# Patient Record
Sex: Male | Born: 1965
Health system: Southern US, Community
[De-identification: ages and names within clinical notes are randomized; demographics above are authoritative.]

## PROBLEM LIST (undated history)

## (undated) DIAGNOSIS — H9191 Unspecified hearing loss, right ear: Secondary | ICD-10-CM

## (undated) DIAGNOSIS — M19072 Primary osteoarthritis, left ankle and foot: Secondary | ICD-10-CM

## (undated) DIAGNOSIS — D333 Benign neoplasm of cranial nerves: Secondary | ICD-10-CM

## (undated) DIAGNOSIS — R29810 Facial weakness: Secondary | ICD-10-CM

## (undated) DIAGNOSIS — Z9889 Other specified postprocedural states: Secondary | ICD-10-CM

## (undated) DIAGNOSIS — R112 Nausea with vomiting, unspecified: Secondary | ICD-10-CM

## (undated) HISTORY — PX: OTHER SURGICAL HISTORY: SHX169

## (undated) HISTORY — PX: TONSILLECTOMY: SUR1361

## (undated) HISTORY — DX: Benign neoplasm of cranial nerves: D33.3

## (undated) HISTORY — PX: BRAIN SURGERY: SHX531

---

## 2004-05-12 ENCOUNTER — Emergency Department (HOSPITAL_COMMUNITY): Admission: EM | Admit: 2004-05-12 | Discharge: 2004-05-12 | Payer: Self-pay | Admitting: Family Medicine

## 2004-10-08 ENCOUNTER — Emergency Department (HOSPITAL_COMMUNITY): Admission: EM | Admit: 2004-10-08 | Discharge: 2004-10-08 | Payer: Self-pay | Admitting: Family Medicine

## 2005-10-25 ENCOUNTER — Ambulatory Visit: Payer: Self-pay | Admitting: Family Medicine

## 2005-11-11 ENCOUNTER — Ambulatory Visit: Payer: Self-pay | Admitting: Family Medicine

## 2007-09-22 ENCOUNTER — Ambulatory Visit: Payer: Self-pay | Admitting: Family Medicine

## 2007-09-22 DIAGNOSIS — I10 Essential (primary) hypertension: Secondary | ICD-10-CM | POA: Insufficient documentation

## 2007-09-23 ENCOUNTER — Ambulatory Visit: Payer: Self-pay | Admitting: Family Medicine

## 2010-07-09 ENCOUNTER — Encounter: Payer: Self-pay | Admitting: Family Medicine

## 2010-09-10 ENCOUNTER — Ambulatory Visit: Payer: Self-pay | Admitting: Family Medicine

## 2011-01-22 NOTE — Assessment & Plan Note (Signed)
Summary: bee stings on leg/leg swollen/cjr   Vital Signs:  Patient profile:   45 year old male Weight:      241 pounds O2 Sat:      97 % Temp:     98.6 degrees F Pulse rate:   97 / minute BP sitting:   120 / 84  (left arm) Cuff size:   large  Vitals Entered By: Pura Spice, RN (September 10, 2010 3:29 PM) CC: stung by yellow jackets left foot and ankle.   History of Present Illness: Here for swelling and discomfort in the left lower leg after being stung by 4 or 5 bees yesterday in his yard. No SOB or othe symptoms. He has been taking Benadryl and keeping the leg elevated today.   Allergies (verified): No Known Drug Allergies  Past History:  Past Medical History: Reviewed history from 09/22/2007 and no changes required. Hypertension  Review of Systems  The patient denies anorexia, fever, weight loss, weight gain, vision loss, decreased hearing, hoarseness, chest pain, syncope, dyspnea on exertion, prolonged cough, headaches, hemoptysis, abdominal pain, melena, hematochezia, severe indigestion/heartburn, hematuria, incontinence, genital sores, muscle weakness, suspicious skin lesions, transient blindness, difficulty walking, depression, unusual weight change, abnormal bleeding, enlarged lymph nodes, angioedema, breast masses, and testicular masses.    Physical Exam  General:  Well-developed,well-nourished,in no acute distress; alert,appropriate and cooperative throughout examination Extremities:  the left lower leg and foot shows swelling, erythema, and warmth. Slightly tender   Impression & Recommendations:  Problem # 1:  CELLULITIS, LEG, LEFT (ICD-682.6)  His updated medication list for this problem includes:    Keflex 500 Mg Caps (Cephalexin) .Marland Kitchen..Marland Kitchen Two times a day  Orders: Depo- Medrol 80mg  (J1040) Admin of Therapeutic Inj  intramuscular or subcutaneous (16109)  Complete Medication List: 1)  Keflex 500 Mg Caps (Cephalexin) .... Two times a day  Patient  Instructions: 1)  Please schedule a follow-up appointment as needed .  Prescriptions: KEFLEX 500 MG CAPS (CEPHALEXIN) two times a day  #14 x 0   Entered and Authorized by:   Nelwyn Salisbury MD   Signed by:   Nelwyn Salisbury MD on 09/10/2010   Method used:   Electronically to        [facility] [id number]* (retail)       [address]       [place], [place]  [address]       Ph: [phone]       Fax: [phone]   RxID:   [id number]    Medication Administration  Injection # 1:    Medication: Depo- Medrol 80mg     Diagnosis: CELLULITIS, LEG, LEFT (ICD-682.6)    Route: IM    Site: RUOQ gluteus    Exp Date: 03/2013    Lot #: [id number]    Mfr: Pharmacia    Patient tolerated injection without complications    Given by: Pura Spice, RN (September 10, 2010 4:12 PM)  Orders Added: 1)  Est. Patient Level III [44010] 2)  Depo- Medrol 80mg  [J1040] 3)  Admin of Therapeutic Inj  intramuscular or subcutaneous [27253]

## 2011-01-22 NOTE — Letter (Signed)
Summary: [facility]   Imported By: Maryln Gottron 07/12/2010 14:44:16  _____________________________________________________________________  External Attachment:    Type:   Image     Comment:   External Document

## 2011-04-05 ENCOUNTER — Ambulatory Visit (INDEPENDENT_AMBULATORY_CARE_PROVIDER_SITE_OTHER): Payer: BLUE CROSS/BLUE SHIELD | Admitting: Internal Medicine

## 2011-04-05 ENCOUNTER — Encounter: Payer: Self-pay | Admitting: Internal Medicine

## 2011-04-05 VITALS — BP 118/70 | Temp 98.5°F | Ht 73.0 in | Wt 239.0 lb

## 2011-04-05 DIAGNOSIS — J45909 Unspecified asthma, uncomplicated: Secondary | ICD-10-CM

## 2011-04-05 DIAGNOSIS — R05 Cough: Secondary | ICD-10-CM

## 2011-04-05 MED ORDER — HYDROCODONE-HOMATROPINE 5-1.5 MG/5ML PO SYRP: 5.0000 mL | ORAL_SOLUTION | Freq: Four times a day (QID) | ORAL | Status: AC | PRN

## 2011-04-05 MED ORDER — FEXOFENADINE HCL 180 MG PO TABS: 180.0000 mg | ORAL_TABLET | Freq: Every day | ORAL | Status: DC

## 2011-04-05 MED ORDER — MOMETASONE FURO-FORMOTEROL FUM 100-5 MCG/ACT IN AERO: 1.0000 | INHALATION_SPRAY | Freq: Two times a day (BID) | RESPIRATORY_TRACT | Status: DC

## 2011-04-05 MED ORDER — METHYLPREDNISOLONE ACETATE 80 MG/ML IJ SUSP
80.0000 mg | Freq: Once | INTRAMUSCULAR | Status: AC
  Administered 2011-04-05: 80 mg via INTRAMUSCULAR

## 2011-04-05 NOTE — Patient Instructions (Signed)
Get plenty of rest, Drink lots of  clear liquids, and use medications as directed  Call or return to clinic prn if these symptoms worsen or fail to improve as anticipated.

## 2011-04-05 NOTE — Progress Notes (Signed)
  Subjective:    Patient ID: Donald Johnson, male    DOB: August 22, 1966, 45 y.o.   MRN: [id number]  HPI  45 year old patient who has a remote history of asthma who presents with a two-week history of refractory incessant cough. This is interfering with sleep and is largely nonproductive he has had some mild associated wheezing cough has been refractory to Mucinex and in the eye albuterol. There's been no fever or chills denies any chest pain.    Review of Systems  Constitutional: Negative for fever, chills, appetite change and fatigue.  HENT: Positive for congestion and postnasal drip. Negative for hearing loss, ear pain, sore throat, trouble swallowing, neck stiffness, dental problem, voice change and tinnitus.   Eyes: Negative for pain, discharge and visual disturbance.  Respiratory: Positive for cough. Negative for chest tightness, wheezing and stridor.   Cardiovascular: Negative for chest pain, palpitations and leg swelling.  Gastrointestinal: Negative for nausea, vomiting, abdominal pain, diarrhea, constipation, blood in stool and abdominal distention.  Genitourinary: Negative for urgency, hematuria, flank pain, discharge, difficulty urinating and genital sores.  Musculoskeletal: Negative for myalgias, back pain, joint swelling, arthralgias and gait problem.  Skin: Negative for rash.  Neurological: Negative for dizziness, syncope, speech difficulty, weakness, numbness and headaches.  Hematological: Negative for adenopathy. Does not bruise/bleed easily.  Psychiatric/Behavioral: Negative for behavioral problems and dysphoric mood. The patient is not nervous/anxious.        Objective:   Physical Exam  Constitutional: He is oriented to person, place, and time. He appears well-developed.  HENT:  Head: Normocephalic.  Right Ear: External ear normal.  Left Ear: External ear normal.  Eyes: Conjunctivae and EOM are normal.  Neck: Normal range of motion.  Cardiovascular: Normal rate and  normal heart sounds.   Pulmonary/Chest: Effort normal. He has wheezes.       Some faint end expiratory wheezing  Abdominal: Bowel sounds are normal.  Musculoskeletal: Normal range of motion. He exhibits no edema and no tenderness.  Neurological: He is alert and oriented to person, place, and time.  Psychiatric: He has a normal mood and affect. His behavior is normal.          Assessment & Plan:   Asthma exacerbation with mild wheezing and cough. We'll treat aggressively with Depo-Medrol 80 mg IM. The patient was given samples of Provera metered dose inhaler Allegra. Is also given a prescription for an antitussives.

## 2011-07-18 ENCOUNTER — Other Ambulatory Visit: Payer: Self-pay | Admitting: Podiatry

## 2011-07-18 DIAGNOSIS — M779 Enthesopathy, unspecified: Secondary | ICD-10-CM

## 2011-07-18 DIAGNOSIS — R52 Pain, unspecified: Secondary | ICD-10-CM

## 2011-07-18 DIAGNOSIS — M1990 Unspecified osteoarthritis, unspecified site: Secondary | ICD-10-CM

## 2011-07-22 ENCOUNTER — Ambulatory Visit
Admission: RE | Admit: 2011-07-22 | Discharge: 2011-07-22 | Disposition: A | Discharge status: Home / Self Care | Payer: BLUE CROSS/BLUE SHIELD | Source: Ambulatory Visit | Attending: Podiatry | Admitting: Podiatry

## 2011-07-22 DIAGNOSIS — R52 Pain, unspecified: Secondary | ICD-10-CM

## 2011-07-22 DIAGNOSIS — M779 Enthesopathy, unspecified: Secondary | ICD-10-CM

## 2011-07-22 DIAGNOSIS — M1990 Unspecified osteoarthritis, unspecified site: Secondary | ICD-10-CM

## 2012-09-11 ENCOUNTER — Ambulatory Visit (INDEPENDENT_AMBULATORY_CARE_PROVIDER_SITE_OTHER): Payer: BLUE CROSS/BLUE SHIELD | Admitting: Family Medicine

## 2012-09-11 ENCOUNTER — Encounter: Payer: Self-pay | Admitting: Family Medicine

## 2012-09-11 VITALS — BP 116/78 | HR 86 | Temp 98.5°F | Wt 246.0 lb

## 2012-09-11 DIAGNOSIS — R42 Dizziness and giddiness: Secondary | ICD-10-CM

## 2012-09-11 LAB — BASIC METABOLIC PANEL
BUN: 19 mg/dL (ref 6–23)
CO2: 26 mEq/L (ref 19–32)
Calcium: 9.4 mg/dL (ref 8.4–10.5)
Chloride: 105 mEq/L (ref 96–112)
Creatinine, Ser: 1 mg/dL (ref 0.4–1.5)
GFR: 84.4 mL/min (ref >60.00)
Glucose, Bld: 84 mg/dL (ref 70–99)
Potassium: 4 mEq/L (ref 3.5–5.1)
Sodium: 139 mEq/L (ref 135–145)

## 2012-09-11 LAB — TSH: TSH: 0.7 u[IU]/mL (ref 0.35–5.50)

## 2012-09-11 LAB — POCT URINALYSIS DIPSTICK
Bilirubin, UA: NEGATIVE
Blood, UA: NEGATIVE
Glucose, UA: NEGATIVE
Ketones, UA: NEGATIVE
Leukocytes, UA: NEGATIVE
Nitrite, UA: NEGATIVE
Protein, UA: NEGATIVE
Spec Grav, UA: 1.02
Urobilinogen, UA: 0.2
pH, UA: 7.5

## 2012-09-11 LAB — HEPATIC FUNCTION PANEL
ALT: 42 U/L (ref 0–53)
AST: 26 U/L (ref 0–37)
Albumin: 4.3 g/dL (ref 3.5–5.2)
Alkaline Phosphatase: 47 U/L (ref 39–117)
Bilirubin, Direct: 0.2 mg/dL (ref 0.0–0.3)
Total Bilirubin: 0.9 mg/dL (ref 0.3–1.2)
Total Protein: 7.7 g/dL (ref 6.0–8.3)

## 2012-09-11 LAB — CBC WITH DIFFERENTIAL/PLATELET
Basophils Absolute: 0 10*3/uL (ref 0.0–0.1)
Basophils Relative: 0.6 % (ref 0.0–3.0)
Eosinophils Absolute: 0.1 10*3/uL (ref 0.0–0.7)
Eosinophils Relative: 1.1 % (ref 0.0–5.0)
HCT: 47.5 % (ref 39.0–52.0)
Hemoglobin: 16.4 g/dL (ref 13.0–17.0)
Lymphocytes Relative: 28.6 % (ref 12.0–46.0)
Lymphs Abs: 1.5 10*3/uL (ref 0.7–4.0)
MCHC: 34.4 g/dL (ref 30.0–36.0)
MCV: 90.7 fl (ref 78.0–100.0)
Monocytes Absolute: 0.5 10*3/uL (ref 0.1–1.0)
Monocytes Relative: 10.2 % (ref 3.0–12.0)
Neutro Abs: 3.1 10*3/uL (ref 1.4–7.7)
Neutrophils Relative %: 59.5 % (ref 43.0–77.0)
Platelets: 209 10*3/uL (ref 150.0–400.0)
RBC: 5.24 Mil/uL (ref 4.22–5.81)
RDW: 12.1 % (ref 11.5–14.6)
WBC: 5.2 10*3/uL (ref 4.5–10.5)

## 2012-09-11 LAB — LIPID PANEL
Cholesterol: 192 mg/dL (ref 0–200)
HDL: 37 mg/dL — ABNORMAL LOW (ref >39.00)
LDL Cholesterol: 122 mg/dL — ABNORMAL HIGH (ref 0–99)
Total CHOL/HDL Ratio: 5
Triglycerides: 167 mg/dL — ABNORMAL HIGH (ref 0.0–149.0)
VLDL: 33.4 mg/dL (ref 0.0–40.0)

## 2012-09-11 LAB — VITAMIN B12: Vitamin B-12: 358 pg/mL (ref 211–911)

## 2012-09-11 NOTE — Progress Notes (Signed)
  Subjective:    Patient ID: Donald Johnson, male    DOB: 1966/01/20, 46 y.o.   MRN: [id number]  HPI Here for 6 months of intermittent dizziness which is more pronounced when he moves quickly. The room does not spin but he feels a dysequilibrium and a slight unsteadiness. He thinks his hearing has decreased as well, and his family agrees. No tinnitus or nausea. No HAs. No sinus pressure or allergies. No vision changes or other neurologic deficits.    Review of Systems  Constitutional: Negative.   HENT: Positive for hearing loss. Negative for ear pain, congestion, rhinorrhea, sneezing, neck pain, neck stiffness, postnasal drip, sinus pressure, tinnitus and ear discharge.   Eyes: Negative.   Respiratory: Negative.   Cardiovascular: Negative.   Neurological: Positive for dizziness. Negative for tremors, seizures, syncope, facial asymmetry, speech difficulty, weakness, light-headedness, numbness and headaches.       Objective:   Physical Exam  Constitutional: He appears well-developed and well-nourished. No distress.  HENT:  Head: Normocephalic and atraumatic.  Right Ear: External ear normal.  Left Ear: External ear normal.  Nose: Nose normal.  Mouth/Throat: Oropharynx is clear and moist.  Eyes: Conjunctivae normal are normal. Pupils are equal, round, and reactive to light.  Neck: Neck supple. No thyromegaly present.  Cardiovascular: Normal rate, regular rhythm, normal heart sounds and intact distal pulses.   Pulmonary/Chest: Effort normal and breath sounds normal.  Lymphadenopathy:    He has no cervical adenopathy.  Neurological: He is alert. He has normal reflexes. No cranial nerve deficit. He exhibits normal muscle tone. Coordination normal.          Assessment & Plan:  Possible vestibular dysfunction. Get labs today. Try Claritin D. He may need an ENT referral.

## 2012-09-14 ENCOUNTER — Encounter: Payer: Self-pay | Admitting: Family Medicine

## 2012-09-14 NOTE — Progress Notes (Signed)
Quick Note:  I left voice message with results and put a copy in mail. ______ 

## 2013-05-24 ENCOUNTER — Ambulatory Visit (INDEPENDENT_AMBULATORY_CARE_PROVIDER_SITE_OTHER): Payer: BC Managed Care – PPO | Admitting: Family Medicine

## 2013-05-24 ENCOUNTER — Encounter: Payer: Self-pay | Admitting: Family Medicine

## 2013-05-24 VITALS — BP 114/84 | HR 81 | Temp 98.2°F | Wt 233.0 lb

## 2013-05-24 DIAGNOSIS — H9191 Unspecified hearing loss, right ear: Secondary | ICD-10-CM

## 2013-05-24 DIAGNOSIS — H9190 Unspecified hearing loss, unspecified ear: Secondary | ICD-10-CM

## 2013-05-24 DIAGNOSIS — H53451 Other localized visual field defect, right eye: Secondary | ICD-10-CM

## 2013-05-24 DIAGNOSIS — D333 Benign neoplasm of cranial nerves: Secondary | ICD-10-CM

## 2013-05-24 DIAGNOSIS — R42 Dizziness and giddiness: Secondary | ICD-10-CM

## 2013-05-24 DIAGNOSIS — H53459 Other localized visual field defect, unspecified eye: Secondary | ICD-10-CM

## 2013-05-24 NOTE — Progress Notes (Signed)
  Subjective:    Patient ID: Donald Johnson, male    DOB: 1966/10/07, 47 y.o.   MRN: [id number]  HPI Here to discuss hearing loss, dizziness, and blurred vision. We spoke last Spetember about some mild vertigo he was experiencing, but this has gotten worse. When he moves his head rapidly he gets dizzy and feels like the room is spinning briefly. He has had more hearing loss especially in the right ear. No pain or tinnitus. He has also noticed blurred vision in his right visual field when he looks to his right side. No HAs.    Review of Systems  Constitutional: Negative.   HENT: Positive for hearing loss. Negative for ear pain, sinus pressure, tinnitus and ear discharge.   Eyes: Positive for visual disturbance. Negative for photophobia.  Respiratory: Negative.   Cardiovascular: Negative.   Neurological: Positive for dizziness. Negative for tremors, seizures, syncope, facial asymmetry, speech difficulty, weakness, light-headedness, numbness and headaches.       Objective:   Physical Exam  Constitutional: He is oriented to person, place, and time. He appears well-developed and well-nourished. No distress.  HENT:  Head: Normocephalic and atraumatic.  Right Ear: External ear normal.  Left Ear: External ear normal.  Nose: Nose normal.  Mouth/Throat: Oropharynx is clear and moist.  Eyes: Conjunctivae are normal.  Neck: Neck supple. No thyromegaly present.  Cardiovascular: Normal rate, regular rhythm, normal heart sounds and intact distal pulses.   Pulmonary/Chest: Effort normal and breath sounds normal.  Lymphadenopathy:    He has no cervical adenopathy.  Neurological: He is alert and oriented to person, place, and time. He has normal reflexes. No cranial nerve deficit. He exhibits normal muscle tone. Coordination normal.          Assessment & Plan:  His symptoms are suggestive of Menieres but we will evaluate with a brain MRI. Also refer to ENT

## 2013-06-01 ENCOUNTER — Ambulatory Visit
Admission: RE | Admit: 2013-06-01 | Discharge: 2013-06-01 | Disposition: A | Discharge status: Home / Self Care | Payer: BC Managed Care – PPO | Source: Ambulatory Visit | Attending: Family Medicine | Admitting: Family Medicine

## 2013-06-01 DIAGNOSIS — R42 Dizziness and giddiness: Secondary | ICD-10-CM

## 2013-06-01 DIAGNOSIS — H53451 Other localized visual field defect, right eye: Secondary | ICD-10-CM

## 2013-06-01 DIAGNOSIS — H9191 Unspecified hearing loss, right ear: Secondary | ICD-10-CM

## 2013-06-01 MED ORDER — GADOBENATE DIMEGLUMINE 529 MG/ML IV SOLN
20.0000 mL | Freq: Once | INTRAVENOUS | Status: AC | PRN
  Administered 2013-06-01: 20 mL via INTRAVENOUS

## 2013-06-04 NOTE — Addendum Note (Signed)
Addended by: Gershon Crane A on: 06/04/2013 08:25 AM   Modules accepted: Orders

## 2013-06-07 ENCOUNTER — Telehealth: Payer: Self-pay | Admitting: Family Medicine

## 2013-06-07 DIAGNOSIS — D333 Benign neoplasm of cranial nerves: Secondary | ICD-10-CM

## 2013-06-07 NOTE — Telephone Encounter (Signed)
Referral was changed.

## 2013-06-07 NOTE — Telephone Encounter (Signed)
Pt states that you had done a referral for [facility], dx: large vestibular schwannoma. He would like to scrap that one and instead have that referral be for: [facility] - Dr. Dwyane Luo.  He also gave me the ph#, just in case for Joni Reining: [phone] Thank you.

## 2013-07-28 DIAGNOSIS — J45990 Exercise induced bronchospasm: Secondary | ICD-10-CM | POA: Insufficient documentation

## 2013-10-28 ENCOUNTER — Other Ambulatory Visit: Payer: Self-pay

## 2014-02-17 ENCOUNTER — Encounter: Payer: Self-pay | Admitting: Podiatry

## 2014-02-17 ENCOUNTER — Ambulatory Visit (INDEPENDENT_AMBULATORY_CARE_PROVIDER_SITE_OTHER): Payer: BC Managed Care – PPO

## 2014-02-17 ENCOUNTER — Ambulatory Visit (INDEPENDENT_AMBULATORY_CARE_PROVIDER_SITE_OTHER): Payer: BC Managed Care – PPO | Admitting: Podiatry

## 2014-02-17 VITALS — BP 115/88 | HR 64 | Resp 12

## 2014-02-17 DIAGNOSIS — M7750 Other enthesopathy of unspecified foot: Secondary | ICD-10-CM

## 2014-02-17 DIAGNOSIS — R52 Pain, unspecified: Secondary | ICD-10-CM

## 2014-02-17 DIAGNOSIS — M19079 Primary osteoarthritis, unspecified ankle and foot: Secondary | ICD-10-CM

## 2014-02-17 MED ORDER — TRIAMCINOLONE ACETONIDE 10 MG/ML IJ SUSP
10.0000 mg | Freq: Once | INTRAMUSCULAR | Status: AC
  Administered 2014-02-17: 10 mg

## 2014-02-17 NOTE — Progress Notes (Signed)
   Subjective:    Patient ID: Donald Johnson, male    DOB: Mar 16, 1966, 48 y.o.   MRN: [id number]  HPI '' LT FOOT ANKLE START BOTHERING FOR 6 MONTHS. THE PAIN IS GETTING WORSE WHEN WALKING.''    Review of Systems  All other systems reviewed and are negative.       Objective:   Physical Exam        Assessment & Plan:

## 2014-02-18 NOTE — Progress Notes (Signed)
Subjective:     Patient ID: Donald Johnson, male   DOB: 03-12-1966, 48 y.o.   MRN: [id number]  HPI patient presents left foot stating this tendon has really started to bother me again and I would like to consider orthotics or possible AFO if it could be of benefit. 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 Tenderness around the peroneal tendon group on the lateral side of the foot underneath the ankle joint    Assessment:     Foot structural changes leading to tendinitis of her chronic nature and long-term arthritis    Plan:     Reviewed condition and did ankle and peroneal injection left patient would make an orthotic candidate we discussed this to be done in the next few weeks

## 2014-03-02 ENCOUNTER — Ambulatory Visit (INDEPENDENT_AMBULATORY_CARE_PROVIDER_SITE_OTHER): Payer: BC Managed Care – PPO | Admitting: Podiatry

## 2014-03-02 DIAGNOSIS — M7750 Other enthesopathy of unspecified foot: Secondary | ICD-10-CM

## 2014-05-09 NOTE — Progress Notes (Signed)
Subjective:     Patient ID: Donald Johnson, male   DOB: Jan 21, 1966, 48 y.o.   MRN: [id number]  HPI patient presents to pick up insert   Review of Systems     Objective:   Physical Exam No change physical exam    Assessment:     Tendinitis    Plan:     Dispensed orthotics

## 2014-05-23 ENCOUNTER — Telehealth: Payer: Self-pay | Admitting: *Deleted

## 2014-05-23 NOTE — Telephone Encounter (Signed)
Looking to see if I can get a referral to Dr. Wylene Simmer at [facility] or get a return call from Dr. Paulla Dolly if he's still practicing.

## 2014-06-02 ENCOUNTER — Ambulatory Visit: Payer: BC Managed Care – PPO | Admitting: Occupational Therapy

## 2014-06-06 NOTE — Telephone Encounter (Signed)
Dr. Paulla Dolly called the patient.

## 2014-08-30 DIAGNOSIS — IMO0001 Reserved for inherently not codable concepts without codable children: Secondary | ICD-10-CM | POA: Insufficient documentation

## 2014-09-07 DIAGNOSIS — M2560 Stiffness of unspecified joint, not elsewhere classified: Secondary | ICD-10-CM | POA: Insufficient documentation

## 2014-09-07 DIAGNOSIS — M19072 Primary osteoarthritis, left ankle and foot: Secondary | ICD-10-CM | POA: Insufficient documentation

## 2014-12-22 ENCOUNTER — Encounter (HOSPITAL_BASED_OUTPATIENT_CLINIC_OR_DEPARTMENT_OTHER): Payer: Self-pay | Admitting: *Deleted

## 2014-12-28 ENCOUNTER — Other Ambulatory Visit: Payer: Self-pay | Admitting: Orthopedic Surgery

## 2014-12-29 ENCOUNTER — Encounter (HOSPITAL_BASED_OUTPATIENT_CLINIC_OR_DEPARTMENT_OTHER)
Admission: RE | Disposition: A | Discharge status: Home / Self Care | Payer: Self-pay | Source: Ambulatory Visit | Attending: Orthopedic Surgery

## 2014-12-29 ENCOUNTER — Ambulatory Visit (HOSPITAL_BASED_OUTPATIENT_CLINIC_OR_DEPARTMENT_OTHER): Payer: BLUE CROSS/BLUE SHIELD | Admitting: Anesthesiology

## 2014-12-29 ENCOUNTER — Ambulatory Visit (HOSPITAL_BASED_OUTPATIENT_CLINIC_OR_DEPARTMENT_OTHER)
Admission: RE | Admit: 2014-12-29 | Discharge: 2014-12-30 | Disposition: A | Discharge status: Home / Self Care | Payer: BLUE CROSS/BLUE SHIELD | Source: Ambulatory Visit | Attending: Orthopedic Surgery | Admitting: Orthopedic Surgery

## 2014-12-29 ENCOUNTER — Encounter (HOSPITAL_BASED_OUTPATIENT_CLINIC_OR_DEPARTMENT_OTHER): Payer: Self-pay | Admitting: Certified Registered"

## 2014-12-29 DIAGNOSIS — H9191 Unspecified hearing loss, right ear: Secondary | ICD-10-CM | POA: Diagnosis not present

## 2014-12-29 DIAGNOSIS — R29810 Facial weakness: Secondary | ICD-10-CM | POA: Diagnosis not present

## 2014-12-29 DIAGNOSIS — M19072 Primary osteoarthritis, left ankle and foot: Secondary | ICD-10-CM | POA: Diagnosis not present

## 2014-12-29 DIAGNOSIS — I10 Essential (primary) hypertension: Secondary | ICD-10-CM | POA: Insufficient documentation

## 2014-12-29 DIAGNOSIS — M19179 Post-traumatic osteoarthritis, unspecified ankle and foot: Secondary | ICD-10-CM | POA: Diagnosis present

## 2014-12-29 DIAGNOSIS — Z86011 Personal history of benign neoplasm of the brain: Secondary | ICD-10-CM | POA: Insufficient documentation

## 2014-12-29 DIAGNOSIS — Z881 Allergy status to other antibiotic agents status: Secondary | ICD-10-CM | POA: Diagnosis not present

## 2014-12-29 HISTORY — DX: Facial weakness: R29.810

## 2014-12-29 HISTORY — DX: Other specified postprocedural states: Z98.890

## 2014-12-29 HISTORY — DX: Unspecified hearing loss, right ear: H91.91

## 2014-12-29 HISTORY — DX: Benign neoplasm of cranial nerves: D33.3

## 2014-12-29 HISTORY — DX: Nausea with vomiting, unspecified: R11.2

## 2014-12-29 HISTORY — DX: Primary osteoarthritis, left ankle and foot: M19.072

## 2014-12-29 HISTORY — PX: ANKLE FUSION: SHX881

## 2014-12-29 HISTORY — DX: Other specified postprocedural states: R11.2

## 2014-12-29 LAB — POCT HEMOGLOBIN-HEMACUE: Hemoglobin: 15.8 g/dL (ref 13.0–17.0)

## 2014-12-29 SURGERY — ARTHRODESIS ANKLE
Anesthesia: Regional | Site: Ankle | Laterality: Left

## 2014-12-29 MED ORDER — MIDAZOLAM HCL 2 MG/2ML IJ SOLN
INTRAMUSCULAR | Status: AC
  Filled 2014-12-29: qty 2

## 2014-12-29 MED ORDER — OXYCODONE HCL 5 MG PO TABS
5.0000 mg | ORAL_TABLET | ORAL | Status: DC | PRN
  Administered 2014-12-30: 10 mg via ORAL
  Filled 2014-12-29: qty 2

## 2014-12-29 MED ORDER — BACITRACIN ZINC 500 UNIT/GM EX OINT
TOPICAL_OINTMENT | CUTANEOUS | Status: AC
  Filled 2014-12-29: qty 28.35

## 2014-12-29 MED ORDER — 0.9 % SODIUM CHLORIDE (POUR BTL) OPTIME
TOPICAL | Status: DC | PRN
  Administered 2014-12-29: 300 mL

## 2014-12-29 MED ORDER — LACTATED RINGERS IV SOLN
INTRAVENOUS | Status: DC
  Administered 2014-12-29 (×2): via INTRAVENOUS

## 2014-12-29 MED ORDER — LIDOCAINE HCL (CARDIAC) 20 MG/ML IV SOLN
INTRAVENOUS | Status: DC | PRN
  Administered 2014-12-29: 30 mg via INTRAVENOUS

## 2014-12-29 MED ORDER — HYDROMORPHONE HCL 1 MG/ML IJ SOLN: 0.2500 mg | INTRAMUSCULAR | Status: DC | PRN

## 2014-12-29 MED ORDER — MIDAZOLAM HCL 2 MG/2ML IJ SOLN
1.0000 mg | INTRAMUSCULAR | Status: DC | PRN
  Administered 2014-12-29: 2 mg via INTRAVENOUS

## 2014-12-29 MED ORDER — ASPIRIN EC 325 MG PO TBEC: 325.0000 mg | DELAYED_RELEASE_TABLET | Freq: Every day | ORAL | Status: DC

## 2014-12-29 MED ORDER — SODIUM CHLORIDE 0.9 % IV SOLN: INTRAVENOUS | Status: DC

## 2014-12-29 MED ORDER — SENNA 8.6 MG PO TABS: 1.0000 | ORAL_TABLET | Freq: Two times a day (BID) | ORAL | Status: DC

## 2014-12-29 MED ORDER — ONDANSETRON HCL 4 MG PO TABS: 4.0000 mg | ORAL_TABLET | Freq: Four times a day (QID) | ORAL | Status: DC | PRN

## 2014-12-29 MED ORDER — FENTANYL CITRATE 0.05 MG/ML IJ SOLN
INTRAMUSCULAR | Status: AC
Start: 2014-12-29 — End: 2014-12-29
  Filled 2014-12-29: qty 6

## 2014-12-29 MED ORDER — ONDANSETRON HCL 4 MG/2ML IJ SOLN
INTRAMUSCULAR | Status: DC | PRN
  Administered 2014-12-29: 4 mg via INTRAVENOUS

## 2014-12-29 MED ORDER — CEFAZOLIN SODIUM-DEXTROSE 2-3 GM-% IV SOLR
2.0000 g | INTRAVENOUS | Status: AC
  Administered 2014-12-29: 2 g via INTRAVENOUS

## 2014-12-29 MED ORDER — SODIUM CHLORIDE 0.9 % IV SOLN
INTRAVENOUS | Status: DC
  Administered 2014-12-29: 14:00:00 via INTRAVENOUS

## 2014-12-29 MED ORDER — PROPOFOL 10 MG/ML IV BOLUS
INTRAVENOUS | Status: DC | PRN
  Administered 2014-12-29: 200 mg via INTRAVENOUS

## 2014-12-29 MED ORDER — FENTANYL CITRATE 0.05 MG/ML IJ SOLN
50.0000 ug | INTRAMUSCULAR | Status: DC | PRN
  Administered 2014-12-29: 100 ug via INTRAVENOUS

## 2014-12-29 MED ORDER — CHLORHEXIDINE GLUCONATE 4 % EX LIQD: 60.0000 mL | Freq: Once | CUTANEOUS | Status: DC

## 2014-12-29 MED ORDER — DEXAMETHASONE SODIUM PHOSPHATE 10 MG/ML IJ SOLN
INTRAMUSCULAR | Status: DC | PRN
  Administered 2014-12-29: 10 mg via INTRAVENOUS

## 2014-12-29 MED ORDER — BUPIVACAINE-EPINEPHRINE (PF) 0.5% -1:200000 IJ SOLN
INTRAMUSCULAR | Status: DC | PRN
  Administered 2014-12-29: 30 mL via PERINEURAL

## 2014-12-29 MED ORDER — OXYCODONE HCL 5 MG PO TABS: 5.0000 mg | ORAL_TABLET | ORAL | Status: DC | PRN

## 2014-12-29 MED ORDER — BUPIVACAINE HCL (PF) 0.5 % IJ SOLN
INTRAMUSCULAR | Status: DC | PRN
  Administered 2014-12-29: 10 mL

## 2014-12-29 MED ORDER — MIDAZOLAM HCL 5 MG/5ML IJ SOLN
INTRAMUSCULAR | Status: DC | PRN
  Administered 2014-12-29: 2 mg via INTRAVENOUS

## 2014-12-29 MED ORDER — DOCUSATE SODIUM 100 MG PO CAPS: 100.0000 mg | ORAL_CAPSULE | Freq: Two times a day (BID) | ORAL | Status: DC

## 2014-12-29 MED ORDER — MORPHINE SULFATE 2 MG/ML IJ SOLN: 1.0000 mg | INTRAMUSCULAR | Status: DC | PRN

## 2014-12-29 MED ORDER — FENTANYL CITRATE 0.05 MG/ML IJ SOLN
INTRAMUSCULAR | Status: AC
  Filled 2014-12-29: qty 2

## 2014-12-29 MED ORDER — ONDANSETRON HCL 4 MG/2ML IJ SOLN: 4.0000 mg | Freq: Four times a day (QID) | INTRAMUSCULAR | Status: DC | PRN

## 2014-12-29 MED ORDER — CEFAZOLIN SODIUM-DEXTROSE 2-3 GM-% IV SOLR
INTRAVENOUS | Status: AC
  Filled 2014-12-29: qty 50

## 2014-12-29 MED ORDER — BACITRACIN ZINC 500 UNIT/GM EX OINT
TOPICAL_OINTMENT | CUTANEOUS | Status: DC | PRN
  Administered 2014-12-29: 1 via TOPICAL

## 2014-12-29 MED ORDER — ENOXAPARIN SODIUM 40 MG/0.4ML ~~LOC~~ SOLN: 40.0000 mg | SUBCUTANEOUS | Status: DC

## 2014-12-29 SURGICAL SUPPLY — 90 items
BANDAGE ESMARK 6X9 LF (GAUZE/BANDAGES/DRESSINGS) ×1 IMPLANT
BIT DRILL 11/64XX180123XX4 (BIT) ×1
BIT DRILL 11/64XX180123XX4.3 (BIT) IMPLANT
BLADE AVERAGE 25MMX9MM (BLADE)
BLADE AVERAGE 25X9 (BLADE) IMPLANT
BLADE MICRO SAGITTAL (BLADE) IMPLANT
BLADE SURG 15 STRL LF DISP TIS (BLADE) ×2 IMPLANT
BLADE SURG 15 STRL SS (BLADE) ×9
BNDG CMPR 9X6 STRL LF SNTH (GAUZE/BANDAGES/DRESSINGS) ×1
BNDG COHESIVE 4X5 TAN STRL (GAUZE/BANDAGES/DRESSINGS) ×3 IMPLANT
BNDG COHESIVE 6X5 TAN STRL LF (GAUZE/BANDAGES/DRESSINGS) ×3 IMPLANT
BNDG ESMARK 6X9 LF (GAUZE/BANDAGES/DRESSINGS) ×3
BOOT STEPPER DURA LG (SOFTGOODS) IMPLANT
BOOT STEPPER DURA MED (SOFTGOODS) IMPLANT
BOOT STEPPER DURA XLG (SOFTGOODS) IMPLANT
CHLORAPREP W/TINT 26ML (MISCELLANEOUS) ×3 IMPLANT
CLOSURE WOUND 1/2 X4 (GAUZE/BANDAGES/DRESSINGS)
COVER BACK TABLE 60X90IN (DRAPES) ×3 IMPLANT
COVER MAYO STAND STRL (DRAPES) IMPLANT
CUFF TOURNIQUET SINGLE 34IN LL (TOURNIQUET CUFF) ×3 IMPLANT
DECANTER SPIKE VIAL GLASS SM (MISCELLANEOUS) IMPLANT
DRAPE C-ARM 42X72 X-RAY (DRAPES) IMPLANT
DRAPE C-ARMOR (DRAPES) IMPLANT
DRAPE EXTREMITY T 121X128X90 (DRAPE) ×3 IMPLANT
DRAPE OEC MINIVIEW 54X84 (DRAPES) ×2 IMPLANT
DRAPE U-SHAPE 47X51 STRL (DRAPES) ×3 IMPLANT
DRAPE UTILITY XL STRL (DRAPES) IMPLANT
DRILL BIT 11/64XX180123XX4.3 (BIT) ×3
DRILL BIT 2.7X124 (BIT) ×2 IMPLANT
DRSG ADAPTIC 3X8 NADH LF (GAUZE/BANDAGES/DRESSINGS) IMPLANT
DRSG EMULSION OIL 3X3 NADH (GAUZE/BANDAGES/DRESSINGS) ×3 IMPLANT
DRSG PAD ABDOMINAL 8X10 ST (GAUZE/BANDAGES/DRESSINGS) ×6 IMPLANT
ELECT REM PT RETURN 9FT ADLT (ELECTROSURGICAL) ×3
ELECTRODE REM PT RTRN 9FT ADLT (ELECTROSURGICAL) ×1 IMPLANT
GAUZE SPONGE 4X4 12PLY STRL (GAUZE/BANDAGES/DRESSINGS) ×3 IMPLANT
GLOVE BIO SURGEON STRL SZ8 (GLOVE) ×3 IMPLANT
GLOVE BIOGEL PI IND STRL 7.0 (GLOVE) IMPLANT
GLOVE BIOGEL PI IND STRL 8 (GLOVE) ×1 IMPLANT
GLOVE BIOGEL PI INDICATOR 7.0 (GLOVE) ×2
GLOVE BIOGEL PI INDICATOR 8 (GLOVE) ×2
GLOVE ECLIPSE 6.5 STRL STRAW (GLOVE) ×2 IMPLANT
GLOVE EXAM NITRILE MD LF STRL (GLOVE) ×2 IMPLANT
GOWN STRL REUS W/ TWL LRG LVL3 (GOWN DISPOSABLE) ×1 IMPLANT
GOWN STRL REUS W/ TWL XL LVL3 (GOWN DISPOSABLE) ×1 IMPLANT
GOWN STRL REUS W/TWL LRG LVL3 (GOWN DISPOSABLE) ×3
GOWN STRL REUS W/TWL XL LVL3 (GOWN DISPOSABLE) ×3
GUIDE PIN (WIRE) ×2 IMPLANT
K-WIRE STABILIS MODEL 2.2X225 (WIRE) ×2 IMPLANT
KIT ASP BONE MRW 60CC (KITS) IMPLANT
MEPITEL 4 X 7.2 IN ×2 IMPLANT
NEEDLE HYPO 22GX1.5 SAFETY (NEEDLE) IMPLANT
NS IRRIG 1000ML POUR BTL (IV SOLUTION) ×3 IMPLANT
PACK BASIN DAY SURGERY FS (CUSTOM PROCEDURE TRAY) ×3 IMPLANT
PAD CAST 4YDX4 CTTN HI CHSV (CAST SUPPLIES) ×2 IMPLANT
PADDING CAST COTTON 4X4 STRL (CAST SUPPLIES) ×3
PADDING CAST COTTON 6X4 STRL (CAST SUPPLIES) ×3 IMPLANT
PENCIL BUTTON HOLSTER BLD 10FT (ELECTRODE) ×3 IMPLANT
PLATE ANTERIOR 95 DEG 20X70 (Plate) ×2 IMPLANT
PLATE COVERLOC ANT TALUS 17X13 (Plate) ×2 IMPLANT
PLATE COVERLOC TIBIA 15X20 (Plate) ×2 IMPLANT
PUTTY DBM STAGRAFT PLUS 2CC (Putty) ×2 IMPLANT
SANITIZER HAND PURELL 535ML FO (MISCELLANEOUS) ×3 IMPLANT
SCREW 4.5X32 (Screw) ×2 IMPLANT
SCREW 4.5X40 (Screw) ×4 IMPLANT
SCREW BONE STABILIS 4.5X30 (Screw) ×4 IMPLANT
SCREW BONE STABILIS 4.5X34 (Screw) ×4 IMPLANT
SCREW BONE STABILIS 4.5X38 (Screw) ×2 IMPLANT
SCREW COMPRESSION 6.5 X 75 MM ×2 IMPLANT
SHEET MEDIUM DRAPE 40X70 STRL (DRAPES) ×3 IMPLANT
SLEEVE SCD COMPRESS KNEE MED (MISCELLANEOUS) ×3 IMPLANT
SPLINT FAST PLASTER 5X30 (CAST SUPPLIES) ×40
SPLINT PLASTER CAST FAST 5X30 (CAST SUPPLIES) ×20 IMPLANT
SPONGE LAP 18X18 X RAY DECT (DISPOSABLE) ×3 IMPLANT
STOCKINETTE 6  STRL (DRAPES) ×2
STOCKINETTE 6 STRL (DRAPES) ×1 IMPLANT
STRIP CLOSURE SKIN 1/2X4 (GAUZE/BANDAGES/DRESSINGS) IMPLANT
SUCTION FRAZIER TIP 10 FR DISP (SUCTIONS) ×3 IMPLANT
SUT ETHILON 3 0 PS 1 (SUTURE) ×3 IMPLANT
SUT MNCRL AB 3-0 PS2 18 (SUTURE) ×3 IMPLANT
SUT VIC AB 0 SH 27 (SUTURE) ×2 IMPLANT
SUT VIC AB 2-0 SH 27 (SUTURE)
SUT VIC AB 2-0 SH 27XBRD (SUTURE) IMPLANT
SUT VICRYL 4-0 PS2 18IN ABS (SUTURE) IMPLANT
SYR BULB 3OZ (MISCELLANEOUS) ×3 IMPLANT
SYR CONTROL 10ML LL (SYRINGE) IMPLANT
TOWEL OR 17X24 6PK STRL BLUE (TOWEL DISPOSABLE) ×5 IMPLANT
TUBE CONNECTING 20'X1/4 (TUBING) ×1
TUBE CONNECTING 20X1/4 (TUBING) ×2 IMPLANT
UNDERPAD 30X30 INCONTINENT (UNDERPADS AND DIAPERS) ×3 IMPLANT
YANKAUER SUCT BULB TIP NO VENT (SUCTIONS) IMPLANT

## 2014-12-29 NOTE — Anesthesia Procedure Notes (Addendum)
Anesthesia Regional Block:  Popliteal block  Pre-Anesthetic Checklist: ,, timeout performed, Correct Patient, Correct Site, Correct Laterality, Correct Procedure, Correct Position, site marked, Risks and benefits discussed, pre-op evaluation, post-op pain management  Laterality: Left  Prep: Maximum Sterile Barrier Precautions used and chloraprep       Needles:  Injection technique: Single-shot  Needle Type: Echogenic Stimulator Needle     Needle Length: 9cm 9 cm Needle Gauge: 21 and 21 G    Additional Needles:  Procedures: ultrasound guided (picture in chart) and nerve stimulator Popliteal block  Nerve Stimulator or Paresthesia:  Response: Peroneal,  Response: Tibial,   Additional Responses:   Narrative:  Start time: 12/29/2014 8:50 AM End time: 12/29/2014 9:03 AM Injection made incrementally with aspirations every 5 mL.  Performed by: Personally  Anesthesiologist: Roderic Palau E  Additional Notes: 2% Lidocaine skin wheel. Saphenous block with 10cc of 0.5% Bupivicaine plain.   Procedure Name: LMA Insertion Date/Time: 12/29/2014 9:41 AM Performed by: Nevena Rozenberg Pre-anesthesia Checklist: Patient identified, Emergency Drugs available, Suction available and Patient being monitored Patient Re-evaluated:Patient Re-evaluated prior to inductionOxygen Delivery Method: Circle System Utilized Preoxygenation: Pre-oxygenation with 100% oxygen Intubation Type: IV induction Ventilation: Mask ventilation without difficulty LMA: LMA inserted LMA Size: 5.0 Number of attempts: 1 Airway Equipment and Method: bite block Placement Confirmation: positive ETCO2 Tube secured with: Tape Dental Injury: Teeth and Oropharynx as per pre-operative assessment

## 2014-12-29 NOTE — H&P (Signed)
Donald Johnson is an 49 y.o. male.   Chief Complaint: left ankle pain HPI: 49 y/o male with PMH of brain tumor excision c/o left ankle pain for many years.  Radiographs reveal varus ankle arthritis.  He has failed non op treatment and presents now for left ankle arthrodesis.  No changes in his health since I saw him last.  Past Medical History  Diagnosis Date  . Vestibular schwannoma surgery at [facility] 2014    right  . Arthritis of ankle, left   . Deafness in right ear   . Facial droop     right  . PONV (postoperative nausea and vomiting)     Past Surgical History  Procedure Laterality Date  . Tonsillectomy    . Repair torn ligaments ankle      left  . Brain surgery  2014 at [facility]    History reviewed. No pertinent family history. Social History:  reports that he has never smoked. He has never used smokeless tobacco. He reports that he drinks alcohol. He reports that he does not use illicit drugs.  Allergies:  Allergies  Allergen Reactions  . Amoxicillin Other (See Comments)    Felt like snakes were crawling on him    No prescriptions prior to admission    Results for orders placed or performed during the hospital encounter of 01-26-2015 (from the past 48 hour(s))  Hemoglobin-hemacue, POC     Status: None   Collection Time: 01/26/2015  8:41 AM  Result Value Ref Range   Hemoglobin 15.8 13.0 - 17.0 g/dL   No results found.  ROS  No recent f/c/n/v/wt loss  Blood pressure 133/104, pulse 62, temperature 97.8 F (36.6 C), temperature source Oral, resp. rate 12, height 6\' 2"  (1.88 m), weight 105.235 kg (232 lb), SpO2 100 %. Physical Exam  wn wd male in nad.  A and O  X4.  Mood and affect normal.  EOMI  L sided facial droop.  Resp unlabored.  L ankle with varus malalignment.  Skin healthy and intact.  2+ dp and pt pulses.  Feels LT normally throughout the foot.  5/5 strength in PF and DF of the ankle and toes.  Assessment/Plan L ankle varus arthritis - to OR for left ankle  arthrodesis.  The risks and benefits of the alternative treatment options have been discussed in detail.  The patient wishes to proceed with surgery and specifically understands risks of bleeding, infection, nerve damage, blood clots, need for additional surgery, amputation and death.   Wylene Simmer 2015-01-26, 9:18 AM

## 2014-12-29 NOTE — Transfer of Care (Signed)
Immediate Anesthesia Transfer of Care Note  Patient: Donald Johnson  Procedure(s) Performed: Procedure(s): LEFT ANKLE  ARTHRODESIS (Left)  Patient Location: PACU  Anesthesia Type:GA combined with regional for post-op pain  Level of Consciousness: awake and patient cooperative  Airway & Oxygen Therapy: Patient Spontanous Breathing and Patient connected to face mask oxygen  Post-op Assessment: Report given to PACU RN and Post -op Vital signs reviewed and stable  Post vital signs: Reviewed and stable  Complications: No apparent anesthesia complications

## 2014-12-29 NOTE — Anesthesia Preprocedure Evaluation (Addendum)
Anesthesia Evaluation  Patient identified by MRN, date of birth, ID band Patient awake    Reviewed: Allergy & Precautions, H&P , NPO status , Patient's Chart, lab work & pertinent test results  History of Anesthesia Complications (+) PONV  Airway Mallampati: I  TM Distance: >3 FB Neck ROM: Full    Dental no notable dental hx. (+) Teeth Intact, Dental Advisory Given   Pulmonary neg pulmonary ROS,  breath sounds clear to auscultation  Pulmonary exam normal       Cardiovascular negative cardio ROS  Rhythm:Regular Rate:Normal     Neuro/Psych Hx. Vestibular schwannoma with surgery 2014 negative neurological ROS  negative psych ROS   GI/Hepatic negative GI ROS, Neg liver ROS,   Endo/Other  negative endocrine ROS  Renal/GU negative Renal ROS  negative genitourinary   Musculoskeletal  (+) Arthritis -, Osteoarthritis,    Abdominal   Peds  Hematology negative hematology ROS (+)   Anesthesia Other Findings   Reproductive/Obstetrics negative OB ROS                           Anesthesia Physical Anesthesia Plan  ASA: II  Anesthesia Plan: General and Regional   Post-op Pain Management:    Induction: Intravenous  Airway Management Planned: LMA  Additional Equipment:   Intra-op Plan:   Post-operative Plan: Extubation in OR  Informed Consent: I have reviewed the patients History and Physical, chart, labs and discussed the procedure including the risks, benefits and alternatives for the proposed anesthesia with the patient or authorized representative who has indicated his/her understanding and acceptance.   Dental advisory given  Plan Discussed with: CRNA  Anesthesia Plan Comments:         Anesthesia Quick Evaluation

## 2014-12-29 NOTE — Brief Op Note (Signed)
12/29/2014  11:45 AM  PATIENT:  Donald Johnson  49 y.o. male  PRE-OPERATIVE DIAGNOSIS:  Left ankle arthritis   POST-OPERATIVE DIAGNOSIS:  Left ankle arthritis   Procedure(s): 1.  LEFT ANKLE  ARTHRODESIS 2.  Left ankle ap, lateral and AP foot xrays  SURGEON:  Wylene Simmer, MD  ASSISTANT: n/a  ANESTHESIA:   General, regional  EBL:  minimal   TOURNIQUET:   Total Tourniquet Time Documented: Thigh (Left) - 92 minutes Total: Thigh (Left) - 92 minutes   COMPLICATIONS:  None apparent  DISPOSITION:  Extubated, awake and stable to recovery.  DICTATION ID:  [id number]

## 2014-12-29 NOTE — Discharge Instructions (Signed)
Donald Simmer, MD [place]  Please read the following information regarding your care after surgery.  Medications  You only need a prescription for the narcotic pain medicine (ex. oxycodone, Percocet, Norco).  All of the other medicines listed below are available over the counter. X acetominophen (Tylenol) 650 mg every 4-6 hours as you need for minor pain X oxycodone as prescribed for moderate to severe pain   Narcotic pain medicine (ex. oxycodone, Percocet, Vicodin) will cause constipation.  To prevent this problem, take the following medicines while you are taking any pain medicine. X docusate sodium (Colace) 100 mg twice a day X senna (Senokot) 2 tablets twice a day  X To help prevent blood clots, take an aspirin (325 mg) once a day for a month after surgery.  You should also get up every hour while you are awake to move around.    Weight Bearing ? Bear weight when you are able on your operated leg or foot. ? Bear weight only on the heel of your operated foot in the post-op shoe. x Do not bear any weight on the operated leg or foot.  Cast / Splint / Dressing x Keep your splint or cast clean and dry.  Dont put anything (coat hanger, pencil, etc) down inside of it.  If it gets damp, use a hair dryer on the cool setting to dry it.  If it gets soaked, call the office to schedule an appointment for a cast change. ? Remove your dressing 3 days after surgery and cover the incisions with dry dressings.    After your dressing, cast or splint is removed; you may shower, but do not soak or scrub the wound.  Allow the water to run over it, and then gently pat it dry.  Swelling It is normal for you to have swelling where you had surgery.  To reduce swelling and pain, keep your toes above your nose for at least 3 days after surgery.  It may be necessary to keep your foot or leg elevated for several weeks.  If it hurts, it should be elevated.  Follow Up Call my office at [phone]  when you are discharged from the hospital or surgery center to schedule an appointment to be seen two weeks after surgery.  Call my office at [phone] if you develop a fever >101.5 F, nausea, vomiting, bleeding from the surgical site or severe pain.

## 2014-12-29 NOTE — Anesthesia Postprocedure Evaluation (Signed)
  Anesthesia Post-op Note  Patient: Donald Johnson  Procedure(s) Performed: Procedure(s): LEFT ANKLE  ARTHRODESIS (Left)  Patient Location: PACU  Anesthesia Type:General and block  Level of Consciousness: awake and alert   Airway and Oxygen Therapy: Patient Spontanous Breathing  Post-op Pain: none  Post-op Assessment: Post-op Vital signs reviewed, Patient's Cardiovascular Status Stable and Respiratory Function Stable  Post-op Vital Signs: Reviewed  Filed Vitals:   12/29/14 1200  BP: 109/63  Pulse: 76  Temp:   Resp: 13    Complications: No apparent anesthesia complications

## 2014-12-29 NOTE — Progress Notes (Signed)
Assisted Dr. Edmond Fitzgerald with left, ultrasound guided, popliteal block. Side rails up, monitors on throughout procedure. See vital signs in flow sheet. Tolerated Procedure well. °

## 2014-12-30 ENCOUNTER — Encounter (HOSPITAL_BASED_OUTPATIENT_CLINIC_OR_DEPARTMENT_OTHER): Payer: Self-pay | Admitting: Orthopedic Surgery

## 2014-12-30 DIAGNOSIS — M19072 Primary osteoarthritis, left ankle and foot: Secondary | ICD-10-CM | POA: Diagnosis not present

## 2014-12-30 NOTE — Op Note (Signed)
Donald Johnson, Donald Johnson             ACCOUNT NO.:  [id number]  MEDICAL RECORD NO.:  [id number]  LOCATION:                                 FACILITY:  PHYSICIAN:  Wylene Simmer, MD             DATE OF BIRTH:  DATE OF PROCEDURE:  12/29/2014 DATE OF DISCHARGE:                              OPERATIVE REPORT   PREOPERATIVE DIAGNOSIS:  Left ankle varus arthritis.  POSTOPERATIVE DIAGNOSIS:  Left ankle varus arthritis.  PROCEDURES: 1. Left ankle arthrodesis. 2. Left ankle AP, lateral and AP foot radiographs.  SURGEON:  Wylene Simmer, MD  ANESTHESIA:  General, regional.  ESTIMATED BLOOD LOSS:  Minimal.  TOURNIQUET TIME:  92 minutes at 250 mmHg.  COMPLICATIONS:  None apparent.  DISPOSITION:  Extubated, awake, and stable to recovery.  INDICATIONS FOR PROCEDURE:  The patient is a 49 year old male with a long history of left ankle pain.  He has failed nonoperative treatment to date and presents today for surgical correction of his varus ankle arthritis.  He understands the risks and benefits, the alternative treatment options, and elects surgical treatment.  He specifically understands the risks of bleeding, infection, nerve damage, blood clots, need for additional surgery, continued pain, amputation, and death.  PROCEDURE IN DETAIL:  After preoperative consent was obtained and the correct operative site was identified, the patient was brought to the operating room and placed supine on the operating table.  General anesthesia was induced.  Preoperative antibiotics were administered. Surgical time-out was taken.  Left lower extremity was prepped and draped in standard sterile fashion with tourniquet around the thigh. The extremity was exsanguinated and the tourniquet was inflated to 250 mmHg.  A longitudinal incision was then made anteriorly over the extensor hallucis longus tendon.  Sharp dissection was carried down through the skin and subcutaneous tissue.  Care was taken to protect  the branches of the superficial peroneal nerve.  The extensor retinaculum was incised and the interval between the extensor hallucis longus and tibialis anterior tendons was developed.  Neurovascular bundle was elevated and retracted laterally and protected throughout the case.  The anterior joint capsule was opened.  A large loose body was removed from the anterior ankle joint.  All the osteophytes were removed with a rongeur.  The ankle joint was then mobilized.  There was end-stage arthritis and remaining cartilage only covered about 40% of the joint surface.  This remaining cartilage was removed from the talar and tibial surfaces as well as the medial and lateral gutters with curettes and rongeurs.  Wound was irrigated copiously.  A small drill bit was used to perforate the subchondral bone on both sides of the joint in multiple locations.  The resultant bone graft was left in place.  A 0.25-inch curved osteotome was then used to break up the remaining subchondral bone.  The joint was reduced and provisionally pinned.  A guidepin was then inserted posterolaterally from the distal tibia to the neck of the talus.  AP foot and lateral ankle radiographs confirmed appropriate position of this guidepin.  A Tornier cannulated partially-threaded screw with a washer was then inserted over the guidepin and pulled the talus back  underneath the tibia and compressed the joint appropriately. The Tornier anterior ankle fusion plate was then selected and applied to the anterior aspect of the ankle.  It was secured distally with three nonlocking screws at the level of the talar neck.  The compression device was then applied and used to compress the joint appropriately. Two nonlocking screws were then inserted securing the plate to the tibia.  The remaining three holes in the proximal portion of the plate were drilled and filled with locking screws.  Final AP ankle, lateral ankle, and AP foot  radiographs showed appropriate position and length of all hardware and appropriate alignment of the tibiotalar joint. Demineralized bone matrix and cancellous allograft chips were packed into the joint prior to reduction.  The two locking caps were then inserted on the plate in tightened position.  The wound was irrigated copiously.  The extensor retinaculum was closed with running 0 Vicryl suture.  Subcutaneous tissue was approximated with Monocryl and the skin was closed with nylon.  The posterior incision was closed with horizontal mattress sutures of 3-0 nylon.  Sterile dressings were applied followed by well-padded short-leg splint.  Tourniquet had been released at 92 minutes after application of the dressings.  The patient was awakened from anesthesia and transported to the recovery room in stable condition.  FOLLOWUP PLAN:  The patient will be nonweightbearing on the left lower extremity.  He will be observed for pain control overnight.  He will start aspirin tomorrow for DVT prophylaxis.  RADIOGRAPHS:  AP ankle, lateral ankle and AP foot radiographs were obtained intraoperatively today.  These show interval arthrodesis of the ankle joint with anterior locking plate and posterior-to-anterior lag screw.  Hardware was appropriately positioned and at the appropriate length.  No other acute injuries noted.     Wylene Simmer, MD     JH/MEDQ  D:  12/29/2014  T:  12/30/2014  Job:  [id number]

## 2015-01-30 ENCOUNTER — Other Ambulatory Visit: Payer: Self-pay | Admitting: Otolaryngology

## 2015-01-30 DIAGNOSIS — D333 Benign neoplasm of cranial nerves: Secondary | ICD-10-CM

## 2015-02-08 ENCOUNTER — Ambulatory Visit
Admission: RE | Admit: 2015-02-08 | Discharge: 2015-02-08 | Disposition: A | Discharge status: Home / Self Care | Payer: BLUE CROSS/BLUE SHIELD | Source: Ambulatory Visit | Attending: Otolaryngology | Admitting: Otolaryngology

## 2015-02-08 DIAGNOSIS — D333 Benign neoplasm of cranial nerves: Secondary | ICD-10-CM

## 2015-02-08 MED ORDER — GADOBENATE DIMEGLUMINE 529 MG/ML IV SOLN
20.0000 mL | Freq: Once | INTRAVENOUS | Status: AC | PRN
  Administered 2015-02-08: 20 mL via INTRAVENOUS

## 2015-12-07 DIAGNOSIS — Z7689 Persons encountering health services in other specified circumstances: Secondary | ICD-10-CM

## 2016-03-16 IMAGING — MR MR HEAD WO/W CM
10 of 11 series · 33 of 48 positions shown · IV contrast (multihance)
Comparison: Prior MRI from 06/01/2013.

CLINICAL DATA: Follow-up examination for vestibular schwannoma,
status post resection. No new symptoms.

EXAM:
MRI HEAD WITHOUT AND WITH CONTRAST
TECHNIQUE: Multiplanar, multiecho pulse sequences of the brain and surrounding
structures were obtained without and with intravenous contrast.
CONTRAST:  20mL MULTIHANCE GADOBENATE DIMEGLUMINE 529 MG/ML IV SOLN

[Series 2: T1 · sagittal · 5.0mm · 0.45mm/px · 3 of 21 slices shown (1 of 3)]
[im 1/21]
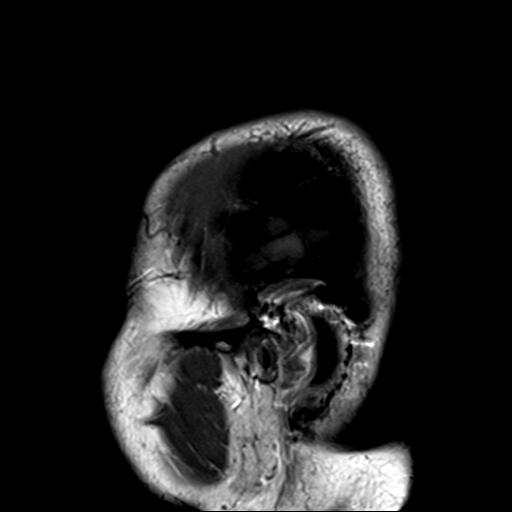
[im 11/21]
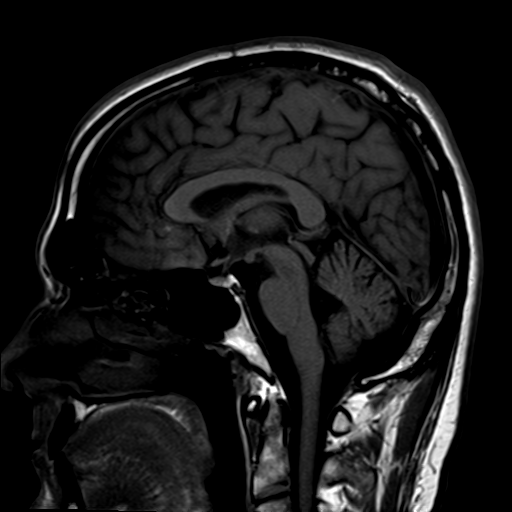
[im 21/21]
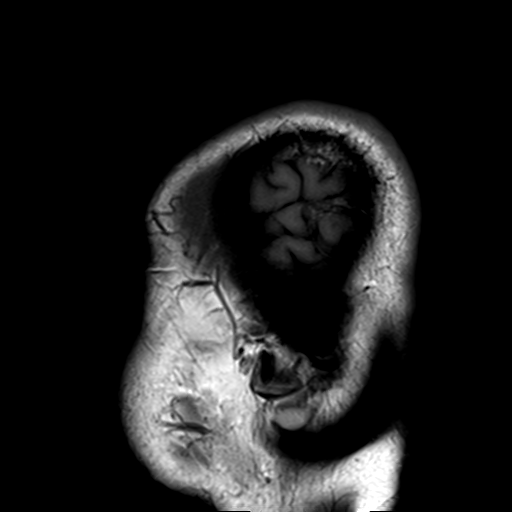

[Series 3: T2 · axial · 5.0mm · 0.45mm/px · z∈[-77,+66]mm · 3 of 23 slices shown]
[im 1/23]
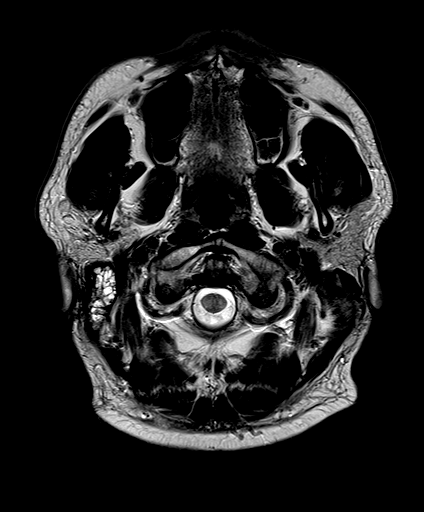
[im 12/23]
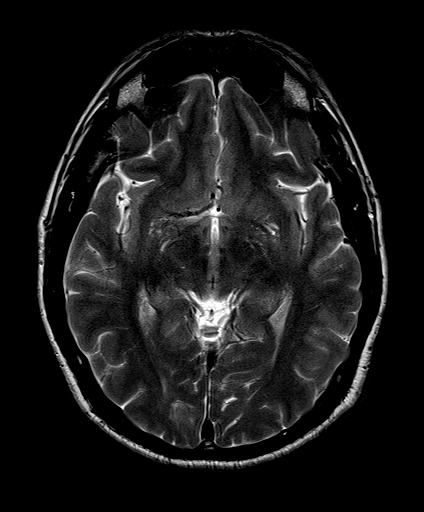
[im 23/23]
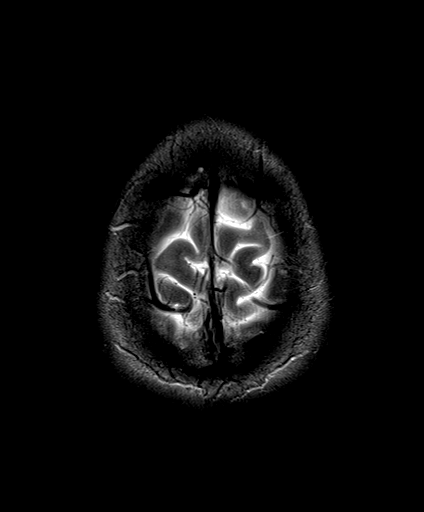

[Series 4: DWI · axial · 3.0mm · 1.80mm/px · z∈[-78,+69]mm · 9 of 100 slices shown (1 of 2)]
[im 1/100]
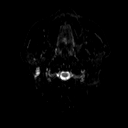
[im 17/100]
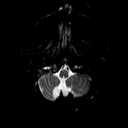
[im 34/100]
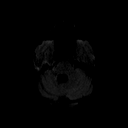
[im 42/100]
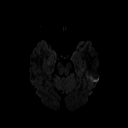
[im 50/100]
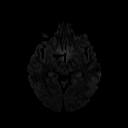
[im 58/100]
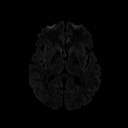
[im 67/100]
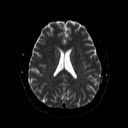
[im 83/100]
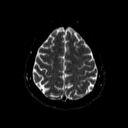
[im 100/100]
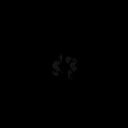

[Series 5: DWI · axial · 3.0mm · 1.80mm/px · z∈[-78,+69]mm · 6 of 46 slices shown (2 of 2)]
[im 1/46]
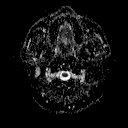
[im 10/46]
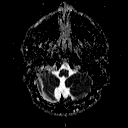
[im 19/46]
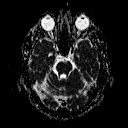
[im 28/46]
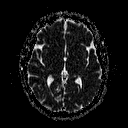
[im 37/46]
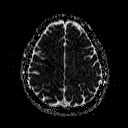
[im 46/46]
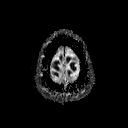

[Series 6: T1 · coronal · 3.0mm · 0.35mm/px · 2 of 13 slices shown (2 of 3)]
[im 1/13]
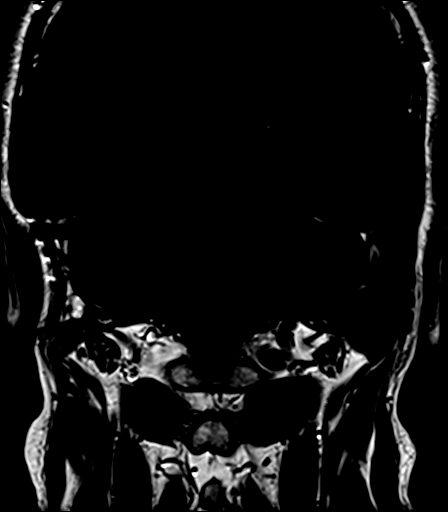
[im 13/13]
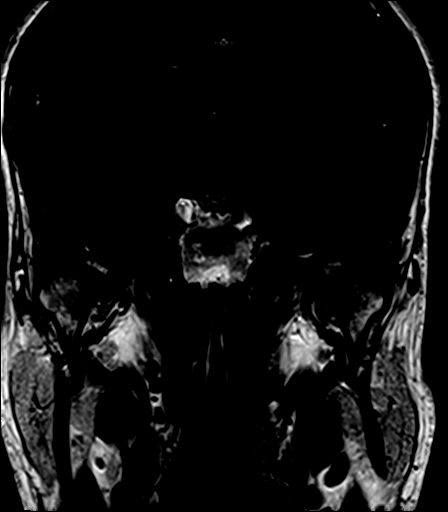

[Series 7: FLAIR · axial · 5.0mm · 0.45mm/px · z∈[-76,+67]mm · 3 of 23 slices shown]
[im 1/23]
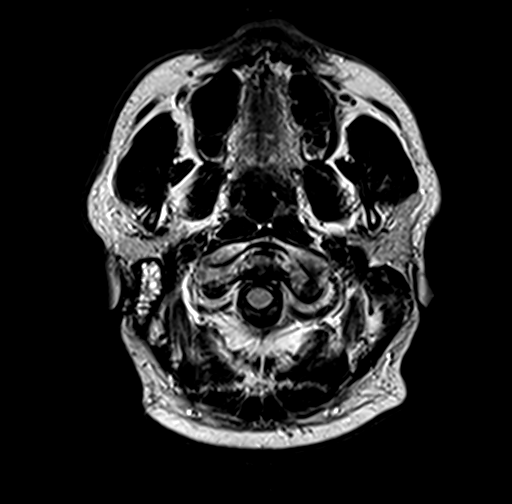
[im 12/23]
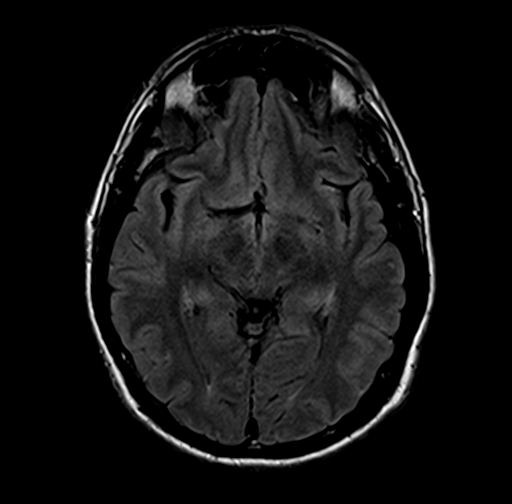
[im 23/23]
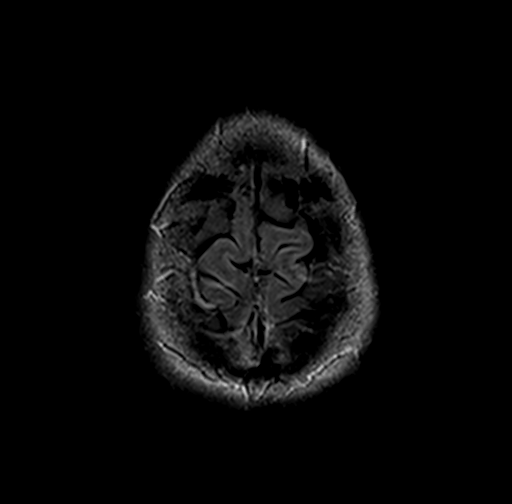

[Series 8: T1 · axial · 3.0mm · 0.35mm/px · 1 of 11 slices shown (3 of 3)]
[im 1/11]
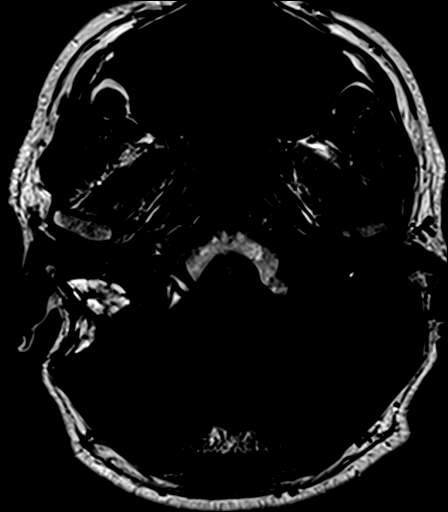

[Series 9: bSSFP · axial · 1.0mm · 0.28mm/px · z∈[-60,-43]mm · 3 of 36 slices shown]
[im 1/36]
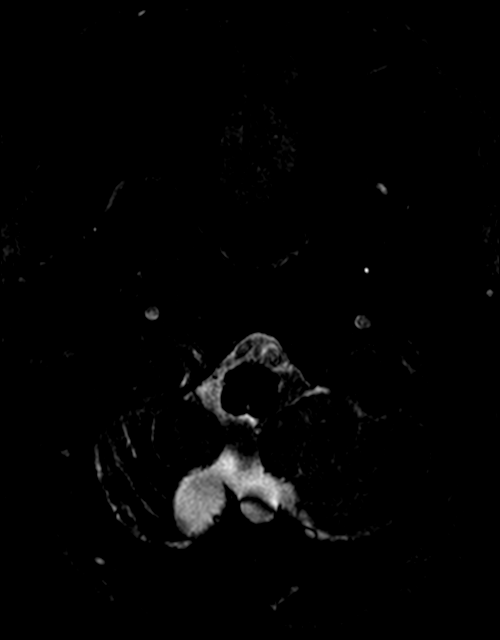
[im 9/36]
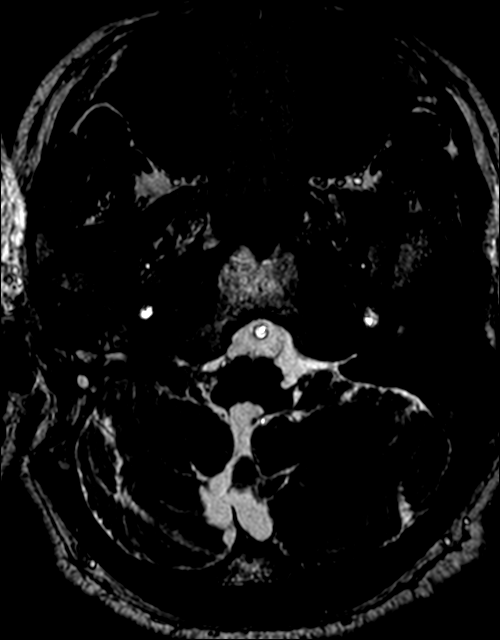
[im 18/36]
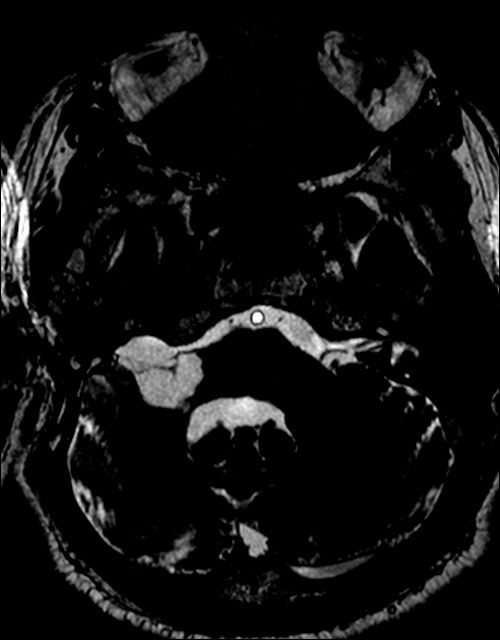

[Series 10: T1 post-contrast · coronal · 3.0mm · 0.35mm/px · 2 of 13 slices shown (1 of 2)]
[im 1/13]
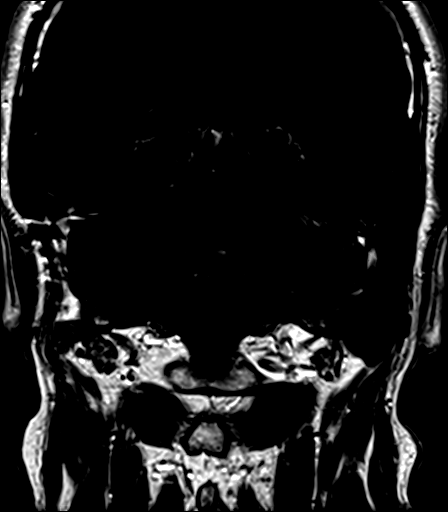
[im 13/13]
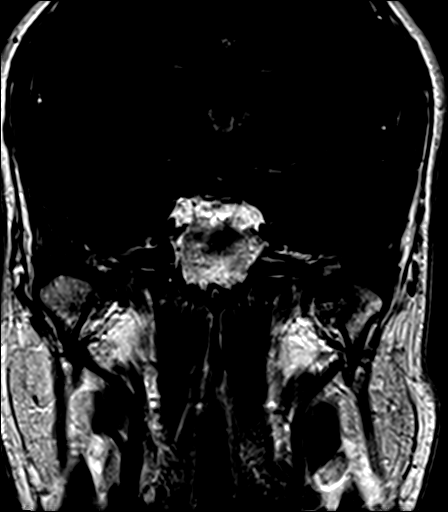

[Series 11: T1 post-contrast · axial · 3.0mm · 0.35mm/px · 1 of 11 slices shown (2 of 2)]
[im 1/11]
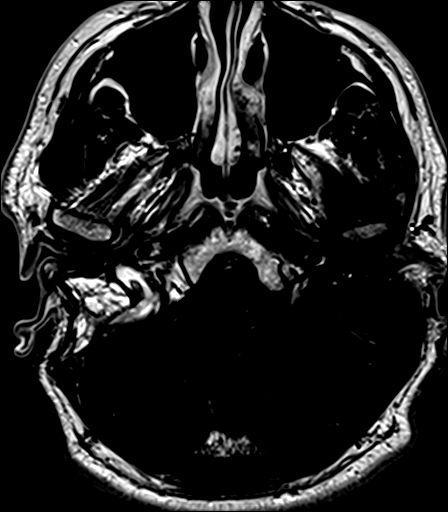

[33 of 48 positions shown; findings below may reference images not displayed]

FINDINGS: Postoperative changes from interval resection of previously
identified vestibular schwannoma at the right CP angle are seen.
There is probable fat packing within the right mastoid air cells.
Cystic changes seen within the resection bed at the right CP angle
extending into the right IAC. Minimal gliosis present within the
adjacent brain parenchyma of the right cerebellar hemisphere, within
normal limits for normal postoperative expected postoperative
changes. No with soft tissue lesion or abnormal enhancement seen on
post-contrast sequences to suggest residual tumor. The right seventh
and eighth cranial nerves are not well visualized.

Left IAC and inner ear structures are within normal limits.

No other mass lesion identified. No midline shift or mass effect. No
hydrocephalus. No abnormal enhancement. No extra-axial fluid
collection.

No acute large vessel territory infarct. No evidence for remote
infarction. Gray-white matter differentiation maintained. Normal
intravascular flow voids are present. No intracranial hemorrhage.

Craniocervical junction is normal. Pituitary gland within normal
limits.

No acute abnormality seen about the orbits.

Paranasal sinuses are clear.  Left mastoid air cells clear.
IMPRESSION: 1. Postoperative changes for interval resection of right vestibular
schwannoma without complication. No residual or recurrent tumor
identified.
2. No other acute intracranial process.

## 2017-03-14 ENCOUNTER — Other Ambulatory Visit: Payer: Self-pay | Admitting: Otolaryngology

## 2017-03-14 DIAGNOSIS — D333 Benign neoplasm of cranial nerves: Secondary | ICD-10-CM

## 2017-03-26 ENCOUNTER — Ambulatory Visit
Admission: RE | Admit: 2017-03-26 | Discharge: 2017-03-26 | Disposition: A | Discharge status: Home / Self Care | Payer: BLUE CROSS/BLUE SHIELD | Source: Ambulatory Visit | Attending: Otolaryngology | Admitting: Otolaryngology

## 2017-03-26 DIAGNOSIS — D333 Benign neoplasm of cranial nerves: Secondary | ICD-10-CM

## 2017-03-26 DIAGNOSIS — D3610 Benign neoplasm of peripheral nerves and autonomic nervous system, unspecified: Secondary | ICD-10-CM | POA: Diagnosis not present

## 2017-04-15 DIAGNOSIS — R29810 Facial weakness: Secondary | ICD-10-CM | POA: Diagnosis not present

## 2017-04-15 DIAGNOSIS — H9041 Sensorineural hearing loss, unilateral, right ear, with unrestricted hearing on the contralateral side: Secondary | ICD-10-CM | POA: Diagnosis not present

## 2017-04-15 DIAGNOSIS — D333 Benign neoplasm of cranial nerves: Secondary | ICD-10-CM | POA: Diagnosis not present

## 2017-09-11 ENCOUNTER — Encounter: Payer: Self-pay | Admitting: Family Medicine

## 2018-03-23 ENCOUNTER — Encounter: Payer: Self-pay | Admitting: Family Medicine

## 2018-03-23 ENCOUNTER — Ambulatory Visit (INDEPENDENT_AMBULATORY_CARE_PROVIDER_SITE_OTHER): Payer: BLUE CROSS/BLUE SHIELD | Admitting: Family Medicine

## 2018-03-23 VITALS — BP 120/86 | HR 71 | Temp 97.8°F | Ht 73.5 in | Wt 240.6 lb

## 2018-03-23 DIAGNOSIS — Z0000 Encounter for general adult medical examination without abnormal findings: Secondary | ICD-10-CM

## 2018-03-23 DIAGNOSIS — R42 Dizziness and giddiness: Secondary | ICD-10-CM

## 2018-03-23 DIAGNOSIS — J3089 Other allergic rhinitis: Secondary | ICD-10-CM | POA: Diagnosis not present

## 2018-03-23 LAB — POC URINALSYSI DIPSTICK (AUTOMATED)
BILIRUBIN UA: NEGATIVE
Glucose, UA: NEGATIVE
Leukocytes, UA: NEGATIVE
Nitrite, UA: NEGATIVE
Spec Grav, UA: >=1.03 — AB (ref 1.010–1.025)
Urobilinogen, UA: 0.2 E.U./dL
pH, UA: 5.5 (ref 5.0–8.0)

## 2018-03-23 MED ORDER — FEXOFENADINE HCL 180 MG PO TABS: 180.0000 mg | ORAL_TABLET | Freq: Every day | ORAL | 0 refills | Status: AC

## 2018-03-23 MED ORDER — FLUTICASONE PROPIONATE 50 MCG/ACT NA SUSP: 2.0000 | Freq: Every day | NASAL | 6 refills | Status: DC

## 2018-03-23 NOTE — Progress Notes (Signed)
   Subjective:    Patient ID: Donald Johnson, male    DOB: 1966-11-24, 51 y.o.   MRN: [id number]  HPI Here to re-establish after a 5 year absence and for a well exam. He feels well except for some occasional slight vertigo. 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 He wears a hearing aid on the left side.    Review of Systems  Constitutional: Negative.   HENT: Positive for hearing loss, postnasal drip, rhinorrhea and sneezing. Negative for congestion, ear discharge, ear pain, nosebleeds and sore throat.   Eyes: Negative.   Respiratory: Negative.   Cardiovascular: Negative.   Gastrointestinal: Negative.   Genitourinary: Negative.   Musculoskeletal: Negative.   Skin: Negative.   Neurological: Positive for dizziness. Negative for seizures, weakness and headaches.  Psychiatric/Behavioral: Negative.        Objective:   Physical Exam  Constitutional: He is oriented to person, place, and time. He appears well-developed and well-nourished. No distress.  HENT:  Head: Normocephalic and atraumatic.  Right Ear: External ear normal.  Left Ear: External ear normal.  Nose: Nose normal.  Mouth/Throat: Oropharynx is clear and moist. No oropharyngeal exudate.  Eyes: Pupils are equal, round, and reactive to light. Conjunctivae and EOM are normal. Right eye exhibits no discharge. Left eye exhibits no discharge. No scleral icterus.  Neck: Neck supple. No JVD present. No tracheal deviation present. No thyromegaly present.  Cardiovascular: Normal rate, regular rhythm, normal heart sounds and intact distal pulses. Exam reveals no gallop and no friction rub.  No murmur heard. Pulmonary/Chest: Effort normal and breath sounds normal. No respiratory distress. He has no wheezes. He has no rales. He exhibits no tenderness.  Abdominal: Soft. Bowel sounds are normal. He exhibits no distension and no mass. There is  no tenderness. There is no rebound and no guarding.  Genitourinary: Rectum normal, prostate normal and penis normal. Rectal exam shows guaiac negative stool. No penile tenderness.  Musculoskeletal: Normal range of motion. He exhibits no edema or tenderness.  Lymphadenopathy:    He has no cervical adenopathy.  Neurological: He is alert and oriented to person, place, and time. He has normal reflexes. No cranial nerve deficit. He exhibits normal muscle tone. Coordination normal.  Skin: Skin is warm and dry. No rash noted. He is not diaphoretic. No erythema. No pallor.  Psychiatric: He has a normal mood and affect. His behavior is normal. Judgment and thought content normal.          Assessment & Plan:  Well exam. We discussed diet and exercise. Get fasting labs.  Alysia Penna, MD

## 2018-03-24 ENCOUNTER — Encounter: Payer: Self-pay | Admitting: Family Medicine

## 2018-03-24 LAB — HEPATIC FUNCTION PANEL
ALBUMIN: 4.4 g/dL (ref 3.5–5.2)
ALT: 30 U/L (ref 0–53)
AST: 19 U/L (ref 0–37)
Alkaline Phosphatase: 56 U/L (ref 39–117)
BILIRUBIN DIRECT: 0.1 mg/dL (ref 0.0–0.3)
Total Bilirubin: 0.8 mg/dL (ref 0.2–1.2)
Total Protein: 7.2 g/dL (ref 6.0–8.3)

## 2018-03-24 LAB — BASIC METABOLIC PANEL
BUN: 16 mg/dL (ref 6–23)
CALCIUM: 9.5 mg/dL (ref 8.4–10.5)
CO2: 30 mEq/L (ref 19–32)
Chloride: 102 mEq/L (ref 96–112)
Creatinine, Ser: 0.95 mg/dL (ref 0.40–1.50)
GFR: 89.43 mL/min (ref >60.00)
Glucose, Bld: 84 mg/dL (ref 70–99)
Potassium: 4.3 mEq/L (ref 3.5–5.1)
SODIUM: 139 meq/L (ref 135–145)

## 2018-03-24 LAB — CBC WITH DIFFERENTIAL/PLATELET
BASOS ABS: 0.1 10*3/uL (ref 0.0–0.1)
Basophils Relative: 1.4 % (ref 0.0–3.0)
Eosinophils Absolute: 0.1 10*3/uL (ref 0.0–0.7)
Eosinophils Relative: 1 % (ref 0.0–5.0)
HEMATOCRIT: 48.6 % (ref 39.0–52.0)
Hemoglobin: 16.7 g/dL (ref 13.0–17.0)
LYMPHS PCT: 24.5 % (ref 12.0–46.0)
Lymphs Abs: 1.6 10*3/uL (ref 0.7–4.0)
MCHC: 34.4 g/dL (ref 30.0–36.0)
MCV: 92 fl (ref 78.0–100.0)
Monocytes Absolute: 0.6 10*3/uL (ref 0.1–1.0)
Monocytes Relative: 9.4 % (ref 3.0–12.0)
Neutro Abs: 4.2 10*3/uL (ref 1.4–7.7)
Neutrophils Relative %: 63.7 % (ref 43.0–77.0)
Platelets: 228 10*3/uL (ref 150.0–400.0)
RBC: 5.28 Mil/uL (ref 4.22–5.81)
RDW: 12.9 % (ref 11.5–15.5)
WBC: 6.7 10*3/uL (ref 4.0–10.5)

## 2018-03-24 LAB — LIPID PANEL
CHOLESTEROL: 218 mg/dL — AB (ref 0–200)
HDL: 47.3 mg/dL (ref >39.00)
LDL Cholesterol: 147 mg/dL — ABNORMAL HIGH (ref 0–99)
NonHDL: 170.9
Total CHOL/HDL Ratio: 5
Triglycerides: 118 mg/dL (ref 0.0–149.0)
VLDL: 23.6 mg/dL (ref 0.0–40.0)

## 2018-03-24 LAB — TSH: TSH: 0.71 u[IU]/mL (ref 0.35–4.50)

## 2018-03-24 LAB — PSA: PSA: 0.91 ng/mL (ref 0.10–4.00)

## 2021-09-25 ENCOUNTER — Other Ambulatory Visit (HOSPITAL_BASED_OUTPATIENT_CLINIC_OR_DEPARTMENT_OTHER): Payer: Self-pay

## 2021-09-25 MED ORDER — FLUARIX QUADRIVALENT 0.5 ML IM SUSY
PREFILLED_SYRINGE | INTRAMUSCULAR | 0 refills | Status: DC
  Filled 2021-09-25: qty 0.5, 1d supply, fill #0

## 2021-11-02 ENCOUNTER — Other Ambulatory Visit: Payer: Self-pay

## 2021-11-02 ENCOUNTER — Encounter: Payer: Self-pay | Admitting: Family Medicine

## 2021-11-02 ENCOUNTER — Ambulatory Visit (INDEPENDENT_AMBULATORY_CARE_PROVIDER_SITE_OTHER): Payer: BC Managed Care – PPO | Admitting: Family Medicine

## 2021-11-02 VITALS — BP 130/82 | HR 69 | Temp 98.6°F | Ht 74.0 in | Wt 240.0 lb

## 2021-11-02 DIAGNOSIS — Z0000 Encounter for general adult medical examination without abnormal findings: Secondary | ICD-10-CM

## 2021-11-02 DIAGNOSIS — R972 Elevated prostate specific antigen [PSA]: Secondary | ICD-10-CM | POA: Diagnosis not present

## 2021-11-02 LAB — CBC WITH DIFFERENTIAL/PLATELET
Basophils Absolute: 0 10*3/uL (ref 0.0–0.1)
Basophils Relative: 0.6 % (ref 0.0–3.0)
Eosinophils Absolute: 0 10*3/uL (ref 0.0–0.7)
Eosinophils Relative: 0.9 % (ref 0.0–5.0)
HCT: 45 % (ref 39.0–52.0)
Hemoglobin: 15.6 g/dL (ref 13.0–17.0)
Lymphocytes Relative: 20.4 % (ref 12.0–46.0)
Lymphs Abs: 1.1 10*3/uL (ref 0.7–4.0)
MCHC: 34.6 g/dL (ref 30.0–36.0)
MCV: 90.1 fl (ref 78.0–100.0)
Monocytes Absolute: 0.5 10*3/uL (ref 0.1–1.0)
Monocytes Relative: 9.1 % (ref 3.0–12.0)
Neutro Abs: 3.6 10*3/uL (ref 1.4–7.7)
Neutrophils Relative %: 69 % (ref 43.0–77.0)
Platelets: 203 10*3/uL (ref 150.0–400.0)
RBC: 4.99 Mil/uL (ref 4.22–5.81)
RDW: 12.4 % (ref 11.5–15.5)
WBC: 5.3 10*3/uL (ref 4.0–10.5)

## 2021-11-02 LAB — BASIC METABOLIC PANEL
BUN: 17 mg/dL (ref 6–23)
CO2: 29 mEq/L (ref 19–32)
Calcium: 9.2 mg/dL (ref 8.4–10.5)
Chloride: 101 mEq/L (ref 96–112)
Creatinine, Ser: 1.02 mg/dL (ref 0.40–1.50)
GFR: 84.24 mL/min (ref >60.00)
Glucose, Bld: 86 mg/dL (ref 70–99)
Potassium: 4.5 mEq/L (ref 3.5–5.1)
Sodium: 138 mEq/L (ref 135–145)

## 2021-11-02 LAB — HEPATIC FUNCTION PANEL
ALT: 20 U/L (ref 0–53)
AST: 16 U/L (ref 0–37)
Albumin: 4.5 g/dL (ref 3.5–5.2)
Alkaline Phosphatase: 52 U/L (ref 39–117)
Bilirubin, Direct: 0.2 mg/dL (ref 0.0–0.3)
Total Bilirubin: 0.9 mg/dL (ref 0.2–1.2)
Total Protein: 7.2 g/dL (ref 6.0–8.3)

## 2021-11-02 LAB — LIPID PANEL
Cholesterol: 201 mg/dL — ABNORMAL HIGH (ref 0–200)
HDL: 46.9 mg/dL (ref >39.00)
LDL Cholesterol: 130 mg/dL — ABNORMAL HIGH (ref 0–99)
NonHDL: 153.62
Total CHOL/HDL Ratio: 4
Triglycerides: 117 mg/dL (ref 0.0–149.0)
VLDL: 23.4 mg/dL (ref 0.0–40.0)

## 2021-11-02 LAB — TSH: TSH: 0.99 u[IU]/mL (ref 0.35–5.50)

## 2021-11-02 LAB — PSA: PSA: 4.87 ng/mL — ABNORMAL HIGH (ref 0.10–4.00)

## 2021-11-02 LAB — HEMOGLOBIN A1C: Hgb A1c MFr Bld: 5.6 % (ref 4.6–6.5)

## 2021-11-02 NOTE — Progress Notes (Signed)
   Subjective:    Patient ID: Donald Johnson, male    DOB: 1966-08-01, 55 y.o.   MRN: [id number]  HPI    Review of Systems     Objective:   Physical Exam        Assessment & Plan:

## 2021-11-02 NOTE — Progress Notes (Signed)
Subjective:    Patient ID: Donald Johnson, male    DOB: 1966-06-18, 55 y.o.   MRN: [id number]  HPI Here for a well exam. He feels fine. We have not seen him for 3 and 1/2 years. He no longer has to follow up with [facility] for the hx of acoustic neuroma.    Review of Systems  Constitutional: Negative.   HENT: Negative.    Eyes: Negative.   Respiratory: Negative.    Cardiovascular: Negative.   Gastrointestinal: Negative.   Genitourinary: Negative.   Musculoskeletal: Negative.   Skin: Negative.   Neurological: Negative.   Psychiatric/Behavioral: Negative.        Objective:   Physical Exam Constitutional:      General: He is not in acute distress.    Appearance: Normal appearance. He is well-developed. He is not diaphoretic.  HENT:     Head: Normocephalic and atraumatic.     Right Ear: External ear normal.     Left Ear: External ear normal.     Nose: Nose normal.     Mouth/Throat:     Pharynx: No oropharyngeal exudate.  Eyes:     General: No scleral icterus.       Right eye: No discharge.        Left eye: No discharge.     Conjunctiva/sclera: Conjunctivae normal.     Pupils: Pupils are equal, round, and reactive to light.  Neck:     Thyroid: No thyromegaly.     Vascular: No JVD.     Trachea: No tracheal deviation.  Cardiovascular:     Rate and Rhythm: Normal rate and regular rhythm.     Heart sounds: Normal heart sounds. No murmur heard.   No friction rub. No gallop.  Pulmonary:     Effort: Pulmonary effort is normal. No respiratory distress.     Breath sounds: Normal breath sounds. No wheezing or rales.  Chest:     Chest wall: No tenderness.  Abdominal:     General: Bowel sounds are normal. There is no distension.     Palpations: Abdomen is soft. There is no mass.     Tenderness: There is no abdominal tenderness. There is no guarding or rebound.  Genitourinary:    Penis: Normal. No tenderness.      Testes: Normal.     Prostate: Normal.     Rectum: Normal.  Guaiac result negative.  Musculoskeletal:        General: No tenderness. Normal range of motion.     Cervical back: Neck supple.  Lymphadenopathy:     Cervical: No cervical adenopathy.  Skin:    General: Skin is warm and dry.     Coloration: Skin is not pale.     Findings: No erythema or rash.  Neurological:     Mental Status: He is alert and oriented to person, place, and time.     Cranial Nerves: No cranial nerve deficit.     Motor: No abnormal muscle tone.     Coordination: Coordination normal.     Deep Tendon Reflexes: Reflexes are normal and symmetric. Reflexes normal.  Psychiatric:        Behavior: Behavior normal.        Thought Content: Thought content normal.        Judgment: Judgment normal.          Assessment & Plan:  Well exam. We discussed diet and exercise. Get fasting labs. Set up a colonoscopy.  Alysia Penna, MD

## 2021-11-02 NOTE — Addendum Note (Signed)
Addended by: Alysia Penna A on: 11/02/2021 01:09 PM   Modules accepted: Orders

## 2021-11-13 ENCOUNTER — Encounter: Payer: Self-pay | Admitting: Gastroenterology

## 2021-12-21 DIAGNOSIS — M25572 Pain in left ankle and joints of left foot: Secondary | ICD-10-CM | POA: Insufficient documentation

## 2022-01-01 ENCOUNTER — Other Ambulatory Visit (HOSPITAL_BASED_OUTPATIENT_CLINIC_OR_DEPARTMENT_OTHER): Payer: Self-pay

## 2022-01-01 ENCOUNTER — Ambulatory Visit (AMBULATORY_SURGERY_CENTER): Payer: BC Managed Care – PPO

## 2022-01-01 ENCOUNTER — Other Ambulatory Visit: Payer: Self-pay

## 2022-01-01 VITALS — Ht 74.0 in | Wt 235.0 lb

## 2022-01-01 DIAGNOSIS — Z1211 Encounter for screening for malignant neoplasm of colon: Secondary | ICD-10-CM

## 2022-01-01 MED ORDER — NA SULFATE-K SULFATE-MG SULF 17.5-3.13-1.6 GM/177ML PO SOLN
1.0000 | Freq: Once | ORAL | 0 refills | Status: AC
  Filled 2022-01-01: qty 354, 2d supply, fill #0

## 2022-01-01 NOTE — Progress Notes (Signed)
  Patient's pre-visit was done today over the phone with the patient   Name,DOB and address verified.   Patient denies any allergies to Eggs and Soy.  Patient denies any problems with anesthesia/sedation. Patient denies taking diet pills or blood thinners.  Denies atrial flutter or atrial fib Denies chronic constipation No home Oxygen.   Packet of Prep instructions mailed to patient including a copy of a consent form-pt is aware.  Patient understands to call us back with any questions or concerns.  Patient is aware of our care-partner policy and Covid-19 safety protocol.   The patient is COVID-19 vaccinated.    

## 2022-01-09 ENCOUNTER — Encounter: Payer: Self-pay | Admitting: Gastroenterology

## 2022-01-15 ENCOUNTER — Ambulatory Visit (AMBULATORY_SURGERY_CENTER): Payer: BC Managed Care – PPO | Admitting: Gastroenterology

## 2022-01-15 ENCOUNTER — Other Ambulatory Visit: Payer: Self-pay

## 2022-01-15 ENCOUNTER — Encounter: Payer: Self-pay | Admitting: Gastroenterology

## 2022-01-15 VITALS — BP 93/55 | HR 63 | Temp 97.1°F | Resp 16 | Ht 74.0 in | Wt 235.0 lb

## 2022-01-15 DIAGNOSIS — K635 Polyp of colon: Secondary | ICD-10-CM | POA: Diagnosis not present

## 2022-01-15 DIAGNOSIS — Z1211 Encounter for screening for malignant neoplasm of colon: Secondary | ICD-10-CM | POA: Diagnosis not present

## 2022-01-15 DIAGNOSIS — D125 Benign neoplasm of sigmoid colon: Secondary | ICD-10-CM

## 2022-01-15 DIAGNOSIS — D122 Benign neoplasm of ascending colon: Secondary | ICD-10-CM

## 2022-01-15 HISTORY — PX: COLONOSCOPY: SHX174

## 2022-01-15 MED ORDER — SODIUM CHLORIDE 0.9 % IV SOLN: 500.0000 mL | Freq: Once | INTRAVENOUS | Status: DC

## 2022-01-15 NOTE — Progress Notes (Signed)
Called to room to assist during endoscopic procedure.  Patient ID and intended procedure confirmed with present staff. Received instructions for my participation in the procedure from the performing physician.  

## 2022-01-15 NOTE — Progress Notes (Signed)
Report to PACU, RN, vss, BBS= Clear.  

## 2022-01-15 NOTE — Op Note (Signed)
[facility] Patient Name: Donald Johnson Procedure Date: 01/15/2022 9:31 AM MRN: [id number] Endoscopist: Nicki Reaper E. Candis Schatz , MD Age: 56 Referring MD:  Date of Birth: 1966-03-15 Gender: Male Account #: [id number] Procedure:                Colonoscopy Indications:              Screening for colorectal malignant neoplasm, This                            is the patient's first colonoscopy Medicines:                Monitored Anesthesia Care Procedure:                Pre-Anesthesia Assessment:                           - Prior to the procedure, a History and Physical                            was performed, and patient medications and                            allergies were reviewed. The patient's tolerance of                            previous anesthesia was also reviewed. The risks                            and benefits of the procedure and the sedation                            options and risks were discussed with the patient.                            All questions were answered, and informed consent                            was obtained. Prior Anticoagulants: The patient has                            taken no previous anticoagulant or antiplatelet                            agents. ASA Grade Assessment: II - A patient with                            mild systemic disease. After reviewing the risks                            and benefits, the patient was deemed in                            satisfactory condition to undergo the procedure.  After obtaining informed consent, the colonoscope                            was passed under direct vision. Throughout the                            procedure, the patient's blood pressure, pulse, and                            oxygen saturations were monitored continuously. The                            Olympus CF-HQ190L [id number]) Colonoscope was                            introduced through the  anus and advanced to the the                            terminal ileum, with identification of the                            appendiceal orifice and IC valve. The colonoscopy                            was performed without difficulty. The patient                            tolerated the procedure well. The quality of the                            bowel preparation was adequate. The terminal ileum,                            ileocecal valve, appendiceal orifice, and rectum                            were photographed. Scope In: 9:42:41 AM Scope Out: 6:59:93 AM Scope Withdrawal Time: 0 hours 11 minutes 52 seconds  Total Procedure Duration: 0 hours 14 minutes 37 seconds  Findings:                 The perianal and digital rectal examinations were                            normal. Pertinent negatives include normal                            sphincter tone and no palpable rectal lesions.                           A 3 mm polyp was found in the ascending colon. The                            polyp was sessile. The polyp was removed with a  cold snare. Resection and retrieval were complete.                            Estimated blood loss was minimal.                           A 4 mm polyp was found in the sigmoid colon. The                            polyp was sessile. The polyp was removed with a                            cold snare. Resection and retrieval were complete.                            Estimated blood loss was minimal.                           Many small and large-mouthed diverticula were found                            in the sigmoid colon and descending colon. There                            was no evidence of diverticular bleeding.                           The exam was otherwise normal throughout the                            examined colon.                           The terminal ileum appeared normal.                           The retroflexed  view of the distal rectum and anal                            verge was normal and showed no anal or rectal                            abnormalities. Complications:            No immediate complications. Estimated Blood Loss:     Estimated blood loss was minimal. Impression:               - One 3 mm polyp in the ascending colon, removed                            with a cold snare. Resected and retrieved.                           - One 4 mm polyp in the sigmoid colon, removed with  a cold snare. Resected and retrieved.                           - Moderate diverticulosis in the sigmoid colon and                            in the descending colon. There was no evidence of                            diverticular bleeding.                           - The examined portion of the ileum was normal.                           - The distal rectum and anal verge are normal on                            retroflexion view. Recommendation:           - Patient has a contact number available for                            emergencies. The signs and symptoms of potential                            delayed complications were discussed with the                            patient. Return to normal activities tomorrow.                            Written discharge instructions were provided to the                            patient.                           - Resume previous diet.                           - Continue present medications.                           - Await pathology results.                           - Repeat colonoscopy (date not yet determined) for                            surveillance based on pathology results. Elyas Villamor E. Candis Schatz, MD 01/15/2022 10:03:17 AM This report has been signed electronically.

## 2022-01-15 NOTE — Progress Notes (Signed)
[facility] History and Physical   Primary Care Physician:  Laurey Morale, MD   Reason for Procedure:   Colon cancer screening  Plan:    Screening colonoscopy     HPI: Donald Johnson is a 56 y.o. male undergoing initial average risk screening colonoscopy.  He has no first degree family history of colon cancer and no chronic GI symptoms.    Past Medical History:  Diagnosis Date   Arthritis of ankle, left    Deafness in right ear    Facial droop    right   PONV (postoperative nausea and vomiting)    Right acoustic neuroma [facility])    saw Dr. 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 Authorizing Provider  fexofenadine (ALLEGRA ALLERGY) 180 MG tablet Take 1 tablet (180 mg total) by mouth daily. 03/23/18   Laurey Morale, MD  fluticasone (FLONASE) 50 MCG/ACT nasal spray Place 2 sprays into both nostrils daily. Patient not taking: Reported on 01/15/2022 03/23/18   Laurey Morale, MD  ibuprofen (ADVIL) 200 MG tablet Take by mouth.    [provider]  loratadine (CLARITIN) 10 MG tablet Take by mouth.    [provider]    Current Outpatient Medications  Medication Sig Dispense Refill   fexofenadine (ALLEGRA ALLERGY) 180 MG tablet Take 1 tablet (180 mg total) by mouth daily. 1 tablet 0   fluticasone (FLONASE) 50 MCG/ACT nasal spray Place 2 sprays into both nostrils daily. (Patient not taking: Reported on 01/15/2022) 16 g 6   ibuprofen (ADVIL) 200 MG tablet Take by mouth.     loratadine  (CLARITIN) 10 MG tablet Take by mouth.     Current Facility-Administered Medications  Medication Dose Route Frequency Provider Last Rate Last Admin   0.9 %  sodium chloride infusion  500 mL Intravenous Once Daryel November, MD        Allergies as of 01/15/2022 - Review Complete 01/15/2022  Allergen Reaction Noted   Amoxicillin Other (See Comments) 12/22/2014    Family History  Problem Relation Age of Onset   Colon cancer Maternal Grandfather    Colon polyps Neg Hx    Esophageal cancer Neg Hx    Rectal cancer Neg Hx    Stomach cancer Neg Hx     Social History   Socioeconomic History   Marital status: Married    Spouse name: Not on file   Number of children: Not on file   Years of education: Not on file   Highest education level: Not on file  Occupational History   Not on file  Tobacco Use   Smoking status: Never   Smokeless tobacco: Never  Vaping Use   Vaping Use: Never used  Substance and Sexual Activity   Alcohol use: Yes    Comment: social   Drug use: No   Sexual activity: Not on file  Other Topics Concern   Not on file  Social  History Narrative   Not on file   Social Determinants of Health   Financial Resource Strain: Not on file  Food Insecurity: Not on file  Transportation Needs: Not on file  Physical Activity: Not on file  Stress: Not on file  Social Connections: Not on file  Intimate Partner Violence: Not on file    Review of Systems:  All other review of systems negative except as mentioned in the HPI.  Physical Exam: Vital signs BP 115/68    Pulse 70    Temp (!) 97.1 F (36.2 C)    Ht 6\' 2"  (1.88 m)    Wt 235 lb (106.6 kg)    SpO2 98%    BMI 30.17 kg/m   General:   Alert,  Well-developed, well-nourished, pleasant and cooperative in NAD Airway:  Mallampati 2 Lungs:  Clear throughout to auscultation.   Heart:  Regular rate and rhythm; no murmurs, clicks, rubs,  or gallops. Abdomen:  Soft, nontender and nondistended. Normal bowel  sounds.   Neuro/Psych:  Normal mood and affect. A and O x 3   Venetia Prewitt E. Candis Schatz, MD [facility]

## 2022-01-15 NOTE — Patient Instructions (Signed)
Discharge instructions given. Handouts on polyps and Diverticulosis. Resume previous medications. YOU HAD AN ENDOSCOPIC PROCEDURE TODAY AT [facility]:   Refer to the procedure report that was given to you for any specific questions about what was found during the examination.  If the procedure report does not answer your questions, please call your gastroenterologist to clarify.  If you requested that your care partner not be given the details of your procedure findings, then the procedure report has been included in a sealed envelope for you to review at your convenience later.  YOU SHOULD EXPECT: Some feelings of bloating in the abdomen. Passage of more gas than usual.  Walking can help get rid of the air that was put into your GI tract during the procedure and reduce the bloating. If you had a lower endoscopy (such as a colonoscopy or flexible sigmoidoscopy) you may notice spotting of blood in your stool or on the toilet paper. If you underwent a bowel prep for your procedure, you may not have a normal bowel movement for a few days.  Please Note:  You might notice some irritation and congestion in your nose or some drainage.  This is from the oxygen used during your procedure.  There is no need for concern and it should clear up in a day or so.  SYMPTOMS TO REPORT IMMEDIATELY:  Following lower endoscopy (colonoscopy or flexible sigmoidoscopy):  Excessive amounts of blood in the stool  Significant tenderness or worsening of abdominal pains  Swelling of the abdomen that is new, acute  Fever of 100F or higher   For urgent or emergent issues, a gastroenterologist can be reached at any hour by calling [phone]. Do not use MyChart messaging for urgent concerns.    DIET:  We do recommend a small meal at first, but then you may proceed to your regular diet.  Drink plenty of fluids but you should avoid alcoholic beverages for 24 hours.  ACTIVITY:  You should plan to take it  easy for the rest of today and you should NOT DRIVE or use heavy machinery until tomorrow (because of the sedation medicines used during the test).    FOLLOW UP: Our staff will call the number listed on your records 48-72 hours following your procedure to check on you and address any questions or concerns that you may have regarding the information given to you following your procedure. If we do not reach you, we will leave a message.  We will attempt to reach you two times.  During this call, we will ask if you have developed any symptoms of COVID 19. If you develop any symptoms (ie: fever, flu-like symptoms, shortness of breath, cough etc.) before then, please call [phone].  If you test positive for Covid 19 in the 2 weeks post procedure, please call and report this information to [place].    If any biopsies were taken you will be contacted by phone or by letter within the next 1-3 weeks.  Please call us at [phone] if you have not heard about the biopsies in 3 weeks.    SIGNATURES/CONFIDENTIALITY: You and/or your care partner have signed paperwork which will be entered into your electronic medical record.  These signatures attest to the fact that that the information above on your After Visit Summary has been reviewed and is understood.  Full responsibility of the confidentiality of this discharge information lies with you and/or your care-partner.

## 2022-01-17 ENCOUNTER — Telehealth: Payer: Self-pay | Admitting: *Deleted

## 2022-01-17 ENCOUNTER — Telehealth: Payer: Self-pay

## 2022-01-17 NOTE — Telephone Encounter (Signed)
°  Follow up Call-  Call back number 01/15/2022  Post procedure Call Back phone  # [phone]  Permission to leave phone message Yes  Some recent data might be hidden     Patient questions:  Do you have a fever, pain , or abdominal swelling? No. Pain Score  0 *  Have you tolerated food without any problems? Yes.    Have you been able to return to your normal activities? Yes.    Do you have any questions about your discharge instructions: Diet   No. Medications  No. Follow up visit  No.  Do you have questions or concerns about your Care? Yes.    Actions: * If pain score is 4 or above: No action needed, pain <4.  Have you developed a fever since your procedure? no  2.   Have you had an respiratory symptoms (SOB or cough) since your procedure? no  3.   Have you tested positive for COVID 19 since your procedure no  4.   Have you had any family members/close contacts diagnosed with the COVID 19 since your procedure?  no   If yes to any of these questions please route to Joylene John, RN and Joella Prince, RN

## 2022-01-17 NOTE — Telephone Encounter (Signed)
Attempted f/u call. No answer, left VM. 

## 2022-01-18 ENCOUNTER — Encounter: Payer: Self-pay | Admitting: Gastroenterology

## 2022-01-21 NOTE — Progress Notes (Signed)
Donald Johnson,  One polyp which I removed during your recent procedure was proven to be completely benign but is considered a "pre-cancerous" polyp that MAY have grown into cancer if it had not been removed (this is the sessile serrated polyp).  Studies shows that at least 20% of women over age 54 and 30% of men over age 37 have pre-cancerous polyps.  The other polyp removed was a hyperplastic polyp, which is not considered precancerous.  Based on current nationally recognized surveillance guidelines, I recommend that you have a repeat colonoscopy in 7 years.   If you develop any new rectal bleeding, abdominal pain or significant bowel habit changes, please contact me before then.  Mickey Esguerra E. Candis Schatz, MD [facility]

## 2022-02-01 ENCOUNTER — Other Ambulatory Visit: Payer: BC Managed Care – PPO

## 2022-02-08 ENCOUNTER — Other Ambulatory Visit (INDEPENDENT_AMBULATORY_CARE_PROVIDER_SITE_OTHER): Payer: BC Managed Care – PPO

## 2022-02-08 DIAGNOSIS — R972 Elevated prostate specific antigen [PSA]: Secondary | ICD-10-CM

## 2022-02-08 LAB — PSA: PSA: 1.29 ng/mL (ref 0.10–4.00)

## 2022-12-27 ENCOUNTER — Ambulatory Visit (INDEPENDENT_AMBULATORY_CARE_PROVIDER_SITE_OTHER): Payer: 59 | Admitting: Family Medicine

## 2022-12-27 ENCOUNTER — Encounter: Payer: Self-pay | Admitting: Family Medicine

## 2022-12-27 VITALS — BP 132/86 | HR 59 | Temp 97.6°F | Ht 73.5 in | Wt 245.0 lb

## 2022-12-27 DIAGNOSIS — Z0000 Encounter for general adult medical examination without abnormal findings: Secondary | ICD-10-CM

## 2022-12-27 LAB — CBC WITH DIFFERENTIAL/PLATELET
Basophils Absolute: 0 10*3/uL (ref 0.0–0.1)
Basophils Relative: 0.8 % (ref 0.0–3.0)
Eosinophils Absolute: 0 10*3/uL (ref 0.0–0.7)
Eosinophils Relative: 0.7 % (ref 0.0–5.0)
HCT: 47.1 % (ref 39.0–52.0)
Hemoglobin: 16.2 g/dL (ref 13.0–17.0)
Lymphocytes Relative: 21 % (ref 12.0–46.0)
Lymphs Abs: 1.1 10*3/uL (ref 0.7–4.0)
MCHC: 34.5 g/dL (ref 30.0–36.0)
MCV: 92.2 fl (ref 78.0–100.0)
Monocytes Absolute: 0.6 10*3/uL (ref 0.1–1.0)
Monocytes Relative: 11.1 % (ref 3.0–12.0)
Neutro Abs: 3.3 10*3/uL (ref 1.4–7.7)
Neutrophils Relative %: 66.4 % (ref 43.0–77.0)
Platelets: 215 10*3/uL (ref 150.0–400.0)
RBC: 5.11 Mil/uL (ref 4.22–5.81)
RDW: 12.5 % (ref 11.5–15.5)
WBC: 5 10*3/uL (ref 4.0–10.5)

## 2022-12-27 LAB — BASIC METABOLIC PANEL
BUN: 20 mg/dL (ref 6–23)
CO2: 28 mEq/L (ref 19–32)
Calcium: 9.3 mg/dL (ref 8.4–10.5)
Chloride: 104 mEq/L (ref 96–112)
Creatinine, Ser: 1.04 mg/dL (ref 0.40–1.50)
GFR: 81.63 mL/min (ref >60.00)
Glucose, Bld: 96 mg/dL (ref 70–99)
Potassium: 4.6 mEq/L (ref 3.5–5.1)
Sodium: 138 mEq/L (ref 135–145)

## 2022-12-27 LAB — LIPID PANEL
Cholesterol: 228 mg/dL — ABNORMAL HIGH (ref 0–200)
HDL: 51.6 mg/dL (ref >39.00)
LDL Cholesterol: 160 mg/dL — ABNORMAL HIGH (ref 0–99)
NonHDL: 176.7
Total CHOL/HDL Ratio: 4
Triglycerides: 82 mg/dL (ref 0.0–149.0)
VLDL: 16.4 mg/dL (ref 0.0–40.0)

## 2022-12-27 LAB — HEPATIC FUNCTION PANEL
ALT: 31 U/L (ref 0–53)
AST: 22 U/L (ref 0–37)
Albumin: 4.6 g/dL (ref 3.5–5.2)
Alkaline Phosphatase: 56 U/L (ref 39–117)
Bilirubin, Direct: 0.1 mg/dL (ref 0.0–0.3)
Total Bilirubin: 0.8 mg/dL (ref 0.2–1.2)
Total Protein: 7.1 g/dL (ref 6.0–8.3)

## 2022-12-27 LAB — TSH: TSH: 0.83 u[IU]/mL (ref 0.35–5.50)

## 2022-12-27 LAB — PSA: PSA: 0.74 ng/mL (ref 0.10–4.00)

## 2022-12-27 LAB — HEMOGLOBIN A1C: Hgb A1c MFr Bld: 5.5 % (ref 4.6–6.5)

## 2022-12-27 NOTE — Progress Notes (Signed)
   Subjective:    Patient ID: Donald Johnson, male    DOB: [birth year], 57 y.o.   MRN: [id number]  HPI Here for a well exam. He feels well.    Review of Systems  Constitutional: Negative.   HENT: Negative.    Eyes: Negative.   Respiratory: Negative.    Cardiovascular: Negative.   Gastrointestinal: Negative.   Genitourinary: Negative.   Musculoskeletal: Negative.   Skin: Negative.   Neurological: Negative.   Psychiatric/Behavioral: Negative.         Objective:   Physical Exam Constitutional:      General: He is not in acute distress.    Appearance: Normal appearance. He is well-developed. He is not diaphoretic.  HENT:     Head: Normocephalic and atraumatic.     Right Ear: External ear normal.     Left Ear: External ear normal.     Nose: Nose normal.     Mouth/Throat:     Pharynx: No oropharyngeal exudate.  Eyes:     General: No scleral icterus.       Right eye: No discharge.        Left eye: No discharge.     Conjunctiva/sclera: Conjunctivae normal.     Pupils: Pupils are equal, round, and reactive to light.  Neck:     Thyroid: No thyromegaly.     Vascular: No JVD.     Trachea: No tracheal deviation.  Cardiovascular:     Rate and Rhythm: Normal rate and regular rhythm.     Heart sounds: Normal heart sounds. No murmur heard.    No friction rub. No gallop.  Pulmonary:     Effort: Pulmonary effort is normal. No respiratory distress.     Breath sounds: Normal breath sounds. No wheezing or rales.  Chest:     Chest wall: No tenderness.  Abdominal:     General: Bowel sounds are normal. There is no distension.     Palpations: Abdomen is soft. There is no mass.     Tenderness: There is no abdominal tenderness. There is no guarding or rebound.  Genitourinary:    Penis: Normal. No tenderness.      Testes: Normal.     Prostate: Normal.     Rectum: Normal. Guaiac result negative.  Musculoskeletal:        General: No tenderness. Normal range of motion.     Cervical  back: Neck supple.  Lymphadenopathy:     Cervical: No cervical adenopathy.  Skin:    General: Skin is warm and dry.     Coloration: Skin is not pale.     Findings: No erythema or rash.  Neurological:     Mental Status: He is alert and oriented to person, place, and time.     Cranial Nerves: No cranial nerve deficit.     Motor: No abnormal muscle tone.     Coordination: Coordination normal.     Deep Tendon Reflexes: Reflexes are normal and symmetric. Reflexes normal.  Psychiatric:        Behavior: Behavior normal.        Thought Content: Thought content normal.        Judgment: Judgment normal.           Assessment & Plan:  Well exam. We discussed diet and exercise. Get fasting labs. Alysia Penna, MD

## 2023-01-02 ENCOUNTER — Other Ambulatory Visit: Payer: Self-pay

## 2023-01-02 ENCOUNTER — Telehealth: Payer: Self-pay | Admitting: Family Medicine

## 2023-01-02 DIAGNOSIS — E785 Hyperlipidemia, unspecified: Secondary | ICD-10-CM

## 2023-01-02 NOTE — Telephone Encounter (Signed)
Spoke with patient about results.

## 2023-01-02 NOTE — Telephone Encounter (Signed)
Pt is returning mykal call and would like blood work

## 2023-01-10 ENCOUNTER — Encounter: Payer: BC Managed Care – PPO | Admitting: Family Medicine

## 2023-04-04 ENCOUNTER — Other Ambulatory Visit: Payer: 59

## 2023-06-23 DIAGNOSIS — H903 Sensorineural hearing loss, bilateral: Secondary | ICD-10-CM | POA: Diagnosis not present

## 2023-06-23 DIAGNOSIS — T162XXA Foreign body in left ear, initial encounter: Secondary | ICD-10-CM | POA: Diagnosis not present

## 2024-01-02 ENCOUNTER — Ambulatory Visit (INDEPENDENT_AMBULATORY_CARE_PROVIDER_SITE_OTHER): Payer: 59 | Admitting: Family Medicine

## 2024-01-02 ENCOUNTER — Encounter: Payer: Self-pay | Admitting: Family Medicine

## 2024-01-02 VITALS — BP 126/80 | HR 58 | Temp 97.7°F | Ht 73.25 in | Wt 236.0 lb

## 2024-01-02 DIAGNOSIS — Z0000 Encounter for general adult medical examination without abnormal findings: Secondary | ICD-10-CM | POA: Diagnosis not present

## 2024-01-02 LAB — CBC WITH DIFFERENTIAL/PLATELET
Basophils Absolute: 0 10*3/uL (ref 0.0–0.1)
Basophils Relative: 1 % (ref 0.0–3.0)
Eosinophils Absolute: 0 10*3/uL (ref 0.0–0.7)
Eosinophils Relative: 1.3 % (ref 0.0–5.0)
HCT: 47 % (ref 39.0–52.0)
Hemoglobin: 15.7 g/dL (ref 13.0–17.0)
Lymphocytes Relative: 23.6 % (ref 12.0–46.0)
Lymphs Abs: 0.9 10*3/uL (ref 0.7–4.0)
MCHC: 33.3 g/dL (ref 30.0–36.0)
MCV: 94.1 fL (ref 78.0–100.0)
Monocytes Absolute: 0.4 10*3/uL (ref 0.1–1.0)
Monocytes Relative: 11.3 % (ref 3.0–12.0)
Neutro Abs: 2.4 10*3/uL (ref 1.4–7.7)
Neutrophils Relative %: 62.8 % (ref 43.0–77.0)
Platelets: 211 10*3/uL (ref 150.0–400.0)
RBC: 5 Mil/uL (ref 4.22–5.81)
RDW: 12.5 % (ref 11.5–15.5)
WBC: 3.8 10*3/uL — ABNORMAL LOW (ref 4.0–10.5)

## 2024-01-02 LAB — BASIC METABOLIC PANEL
BUN: 24 mg/dL — ABNORMAL HIGH (ref 6–23)
CO2: 30 meq/L (ref 19–32)
Calcium: 9.5 mg/dL (ref 8.4–10.5)
Chloride: 101 meq/L (ref 96–112)
Creatinine, Ser: 1.05 mg/dL (ref 0.40–1.50)
GFR: 80.13 mL/min (ref >60.00)
Glucose, Bld: 87 mg/dL (ref 70–99)
Potassium: 5.1 meq/L (ref 3.5–5.1)
Sodium: 138 meq/L (ref 135–145)

## 2024-01-02 LAB — LIPID PANEL
Cholesterol: 194 mg/dL (ref 0–200)
HDL: 47.6 mg/dL (ref >39.00)
LDL Cholesterol: 129 mg/dL — ABNORMAL HIGH (ref 0–99)
NonHDL: 145.93
Total CHOL/HDL Ratio: 4
Triglycerides: 83 mg/dL (ref 0.0–149.0)
VLDL: 16.6 mg/dL (ref 0.0–40.0)

## 2024-01-02 LAB — HEPATIC FUNCTION PANEL
ALT: 22 U/L (ref 0–53)
AST: 18 U/L (ref 0–37)
Albumin: 4.6 g/dL (ref 3.5–5.2)
Alkaline Phosphatase: 53 U/L (ref 39–117)
Bilirubin, Direct: 0.1 mg/dL (ref 0.0–0.3)
Total Bilirubin: 0.7 mg/dL (ref 0.2–1.2)
Total Protein: 7.1 g/dL (ref 6.0–8.3)

## 2024-01-02 LAB — HEMOGLOBIN A1C: Hgb A1c MFr Bld: 5.3 % (ref 4.6–6.5)

## 2024-01-02 LAB — PSA: PSA: 1.05 ng/mL (ref 0.10–4.00)

## 2024-01-02 LAB — TSH: TSH: 1.32 u[IU]/mL (ref 0.35–5.50)

## 2024-01-02 NOTE — Progress Notes (Signed)
 Subjective:    Patient ID: Donald Johnson, male    DOB: Apr 12, 1966, 58 y.o.   MRN: [id number]  HPI Here for a well exam. He feels well. He has been working on a diet and exercise program the past 8 weeks, and he has lost 8 lbs. He recently had a hearing evaluation, and there have been no changes.    Review of Systems  Constitutional: Negative.   HENT: Negative.    Eyes: Negative.   Respiratory: Negative.    Cardiovascular: Negative.   Gastrointestinal: Negative.   Genitourinary: Negative.   Musculoskeletal: Negative.   Skin: Negative.   Neurological: Negative.   Psychiatric/Behavioral: Negative.         Objective:   Physical Exam Constitutional:      General: He is not in acute distress.    Appearance: Normal appearance. He is well-developed. He is not diaphoretic.  HENT:     Head: Normocephalic and atraumatic.     Right Ear: External ear normal.     Left Ear: External ear normal.     Nose: Nose normal.     Mouth/Throat:     Pharynx: No oropharyngeal exudate.  Eyes:     General: No scleral icterus.       Right eye: No discharge.        Left eye: No discharge.     Conjunctiva/sclera: Conjunctivae normal.     Pupils: Pupils are equal, round, and reactive to light.  Neck:     Thyroid : No thyromegaly.     Vascular: No JVD.     Trachea: No tracheal deviation.  Cardiovascular:     Rate and Rhythm: Normal rate and regular rhythm.     Pulses: Normal pulses.     Heart sounds: Normal heart sounds. No murmur heard.    No friction rub. No gallop.  Pulmonary:     Effort: Pulmonary effort is normal. No respiratory distress.     Breath sounds: Normal breath sounds. No wheezing or rales.  Chest:     Chest wall: No tenderness.  Abdominal:     General: Bowel sounds are normal. There is no distension.     Palpations: Abdomen is soft. There is no mass.     Tenderness: There is no abdominal tenderness. There is no guarding or rebound.  Genitourinary:    Penis: Normal. No  tenderness.      Testes: Normal.     Prostate: Normal.     Rectum: Normal. Guaiac result negative.  Musculoskeletal:        General: No tenderness. Normal range of motion.     Cervical back: Neck supple.  Lymphadenopathy:     Cervical: No cervical adenopathy.  Skin:    General: Skin is warm and dry.     Coloration: Skin is not pale.     Findings: No erythema or rash.  Neurological:     General: No focal deficit present.     Mental Status: He is alert and oriented to person, place, and time.     Cranial Nerves: No cranial nerve deficit.     Motor: No abnormal muscle tone.     Coordination: Coordination normal.     Deep Tendon Reflexes: Reflexes are normal and symmetric. Reflexes normal.  Psychiatric:        Mood and Affect: Mood normal.        Behavior: Behavior normal.        Thought Content: Thought content normal.  Judgment: Judgment normal.           Assessment & Plan:  Well exam. We discussed diet and exercise. Get fasting labs. Garnette Olmsted, MD

## 2024-09-01 ENCOUNTER — Other Ambulatory Visit (HOSPITAL_BASED_OUTPATIENT_CLINIC_OR_DEPARTMENT_OTHER): Payer: Self-pay

## 2024-09-01 MED ORDER — FLUZONE 0.5 ML IM SUSY
0.5000 mL | PREFILLED_SYRINGE | Freq: Once | INTRAMUSCULAR | 0 refills | Status: AC
  Filled 2024-09-01: qty 0.5, 1d supply, fill #0

## 2025-01-04 ENCOUNTER — Ambulatory Visit (INDEPENDENT_AMBULATORY_CARE_PROVIDER_SITE_OTHER): Admitting: Family Medicine

## 2025-01-04 ENCOUNTER — Ambulatory Visit: Payer: Self-pay | Admitting: Family Medicine

## 2025-01-04 ENCOUNTER — Encounter: Payer: Self-pay | Admitting: Family Medicine

## 2025-01-04 VITALS — BP 124/78 | HR 64 | Temp 98.4°F | Ht 73.0 in | Wt 247.0 lb

## 2025-01-04 DIAGNOSIS — Z0000 Encounter for general adult medical examination without abnormal findings: Secondary | ICD-10-CM | POA: Diagnosis not present

## 2025-01-04 LAB — BASIC METABOLIC PANEL WITH GFR
BUN: 15 mg/dL (ref 6–23)
CO2: 28 meq/L (ref 19–32)
Calcium: 9.1 mg/dL (ref 8.4–10.5)
Chloride: 103 meq/L (ref 96–112)
Creatinine, Ser: 1.17 mg/dL (ref 0.40–1.50)
GFR: 69.87 mL/min
Glucose, Bld: 94 mg/dL (ref 70–99)
Potassium: 4.4 meq/L (ref 3.5–5.1)
Sodium: 138 meq/L (ref 135–145)

## 2025-01-04 LAB — CBC WITH DIFFERENTIAL/PLATELET
Basophils Absolute: 0 K/uL (ref 0.0–0.1)
Basophils Relative: 0.6 % (ref 0.0–3.0)
Eosinophils Absolute: 0.1 K/uL (ref 0.0–0.7)
Eosinophils Relative: 1.2 % (ref 0.0–5.0)
HCT: 47.5 % (ref 39.0–52.0)
Hemoglobin: 16.2 g/dL (ref 13.0–17.0)
Lymphocytes Relative: 25.5 % (ref 12.0–46.0)
Lymphs Abs: 1.4 K/uL (ref 0.7–4.0)
MCHC: 34.2 g/dL (ref 30.0–36.0)
MCV: 90.5 fl (ref 78.0–100.0)
Monocytes Absolute: 0.6 K/uL (ref 0.1–1.0)
Monocytes Relative: 11.6 % (ref 3.0–12.0)
Neutro Abs: 3.3 K/uL (ref 1.4–7.7)
Neutrophils Relative %: 61.1 % (ref 43.0–77.0)
Platelets: 208 K/uL (ref 150.0–400.0)
RBC: 5.25 Mil/uL (ref 4.22–5.81)
RDW: 12.6 % (ref 11.5–15.5)
WBC: 5.3 K/uL (ref 4.0–10.5)

## 2025-01-04 LAB — HEPATIC FUNCTION PANEL
ALT: 25 U/L (ref 3–53)
AST: 18 U/L (ref 5–37)
Albumin: 4.4 g/dL (ref 3.5–5.2)
Alkaline Phosphatase: 51 U/L (ref 39–117)
Bilirubin, Direct: 0.1 mg/dL (ref 0.1–0.3)
Total Bilirubin: 0.9 mg/dL (ref 0.2–1.2)
Total Protein: 7.2 g/dL (ref 6.0–8.3)

## 2025-01-04 LAB — LIPID PANEL
Cholesterol: 223 mg/dL — ABNORMAL HIGH (ref 28–200)
HDL: 43.6 mg/dL
LDL Cholesterol: 147 mg/dL — ABNORMAL HIGH (ref 10–99)
NonHDL: 179.84
Total CHOL/HDL Ratio: 5
Triglycerides: 166 mg/dL — ABNORMAL HIGH (ref 10.0–149.0)
VLDL: 33.2 mg/dL (ref 0.0–40.0)

## 2025-01-04 LAB — HEMOGLOBIN A1C: Hgb A1c MFr Bld: 5.3 % (ref 4.6–6.5)

## 2025-01-04 LAB — TSH: TSH: 0.82 u[IU]/mL (ref 0.35–5.50)

## 2025-01-04 LAB — PSA: PSA: 0.98 ng/mL (ref 0.10–4.00)

## 2025-01-04 NOTE — Progress Notes (Signed)
 "  Subjective:    Patient ID: Donald Johnson, male    DOB: 1966-02-18, 59 y.o.   MRN: [id number]  HPI Here for a well exam. He feels well.    Review of Systems  Constitutional: Negative.   HENT: Negative.    Eyes: Negative.   Respiratory: Negative.    Cardiovascular: Negative.   Gastrointestinal: Negative.   Genitourinary: Negative.   Musculoskeletal: Negative.   Skin: Negative.   Neurological: Negative.   Psychiatric/Behavioral: Negative.         Objective:   Physical Exam Constitutional:      General: He is not in acute distress.    Appearance: Normal appearance. He is well-developed. He is not diaphoretic.  HENT:     Head: Normocephalic and atraumatic.     Right Ear: External ear normal.     Left Ear: External ear normal.     Nose: Nose normal.     Mouth/Throat:     Pharynx: No oropharyngeal exudate.  Eyes:     General: No scleral icterus.       Right eye: No discharge.        Left eye: No discharge.     Conjunctiva/sclera: Conjunctivae normal.     Pupils: Pupils are equal, round, and reactive to light.  Neck:     Thyroid : No thyromegaly.     Vascular: No JVD.     Trachea: No tracheal deviation.  Cardiovascular:     Rate and Rhythm: Normal rate and regular rhythm.     Pulses: Normal pulses.     Heart sounds: Normal heart sounds. No murmur heard.    No friction rub. No gallop.  Pulmonary:     Effort: Pulmonary effort is normal. No respiratory distress.     Breath sounds: Normal breath sounds. No wheezing or rales.  Chest:     Chest wall: No tenderness.  Abdominal:     General: Bowel sounds are normal. There is no distension.     Palpations: Abdomen is soft. There is no mass.     Tenderness: There is no abdominal tenderness. There is no guarding or rebound.  Genitourinary:    Penis: Normal. No tenderness.      Testes: Normal.     Prostate: Normal.     Rectum: Normal. Guaiac result negative.  Musculoskeletal:        General: No tenderness. Normal  range of motion.     Cervical back: Neck supple.  Lymphadenopathy:     Cervical: No cervical adenopathy.  Skin:    General: Skin is warm and dry.     Coloration: Skin is not pale.     Findings: No erythema or rash.  Neurological:     General: No focal deficit present.     Mental Status: He is alert and oriented to person, place, and time.     Cranial Nerves: No cranial nerve deficit.     Motor: No abnormal muscle tone.     Coordination: Coordination normal.     Deep Tendon Reflexes: Reflexes are normal and symmetric. Reflexes normal.  Psychiatric:        Mood and Affect: Mood normal.        Behavior: Behavior normal.        Thought Content: Thought content normal.        Judgment: Judgment normal.           Assessment & Plan:  Well exam. We discussed diet and exercise. Get fasting labs. Garnette  Johnny, MD   "
